# Patient Record
Sex: Male | Born: 1970
Health system: Southern US, Community
[De-identification: ages and names within clinical notes are randomized; demographics above are authoritative.]

## PROBLEM LIST (undated history)

## (undated) DIAGNOSIS — I10 Essential (primary) hypertension: Secondary | ICD-10-CM

## (undated) DIAGNOSIS — T7840XA Allergy, unspecified, initial encounter: Secondary | ICD-10-CM

## (undated) DIAGNOSIS — E119 Type 2 diabetes mellitus without complications: Secondary | ICD-10-CM

## (undated) DIAGNOSIS — E785 Hyperlipidemia, unspecified: Secondary | ICD-10-CM

## (undated) DIAGNOSIS — J309 Allergic rhinitis, unspecified: Secondary | ICD-10-CM

## (undated) DIAGNOSIS — K449 Diaphragmatic hernia without obstruction or gangrene: Secondary | ICD-10-CM

## (undated) DIAGNOSIS — I639 Cerebral infarction, unspecified: Secondary | ICD-10-CM

## (undated) DIAGNOSIS — K219 Gastro-esophageal reflux disease without esophagitis: Secondary | ICD-10-CM

## (undated) DIAGNOSIS — K4090 Unilateral inguinal hernia, without obstruction or gangrene, not specified as recurrent: Secondary | ICD-10-CM

## (undated) DIAGNOSIS — E669 Obesity, unspecified: Secondary | ICD-10-CM

## (undated) HISTORY — DX: Type 2 diabetes mellitus without complications: E11.9

## (undated) HISTORY — DX: Allergic rhinitis, unspecified: J30.9

## (undated) HISTORY — DX: Obesity, unspecified: E66.9

## (undated) HISTORY — DX: Gastro-esophageal reflux disease without esophagitis: K21.9

## (undated) HISTORY — DX: Unilateral inguinal hernia, without obstruction or gangrene, not specified as recurrent: K40.90

## (undated) HISTORY — DX: Hyperlipidemia, unspecified: E78.5

## (undated) HISTORY — PX: WISDOM TOOTH EXTRACTION: SHX21

## (undated) HISTORY — DX: Allergy, unspecified, initial encounter: T78.40XA

## (undated) HISTORY — DX: Diaphragmatic hernia without obstruction or gangrene: K44.9

## (undated) HISTORY — DX: Cerebral infarction, unspecified: I63.9

---

## 2011-01-01 ENCOUNTER — Emergency Department (HOSPITAL_COMMUNITY): Payer: Self-pay

## 2011-01-01 ENCOUNTER — Emergency Department (HOSPITAL_COMMUNITY)
Admission: EM | Admit: 2011-01-01 | Discharge: 2011-01-01 | Disposition: A | Discharge status: Home / Self Care | Payer: Self-pay | Attending: Emergency Medicine | Admitting: Emergency Medicine

## 2011-01-01 DIAGNOSIS — M25559 Pain in unspecified hip: Secondary | ICD-10-CM | POA: Insufficient documentation

## 2011-01-01 DIAGNOSIS — IMO0002 Reserved for concepts with insufficient information to code with codable children: Secondary | ICD-10-CM | POA: Insufficient documentation

## 2011-01-01 DIAGNOSIS — X500XXA Overexertion from strenuous movement or load, initial encounter: Secondary | ICD-10-CM | POA: Insufficient documentation

## 2011-01-01 DIAGNOSIS — Y9341 Activity, dancing: Secondary | ICD-10-CM | POA: Insufficient documentation

## 2011-01-01 DIAGNOSIS — K219 Gastro-esophageal reflux disease without esophagitis: Secondary | ICD-10-CM | POA: Insufficient documentation

## 2015-05-04 ENCOUNTER — Ambulatory Visit: Payer: Self-pay | Admitting: Family Medicine

## 2015-05-12 ENCOUNTER — Encounter: Payer: Self-pay | Admitting: Family Medicine

## 2015-05-12 ENCOUNTER — Ambulatory Visit (INDEPENDENT_AMBULATORY_CARE_PROVIDER_SITE_OTHER): Payer: BLUE CROSS/BLUE SHIELD | Admitting: Family Medicine

## 2015-05-12 VITALS — BP 134/72 | HR 96 | Temp 99.6°F | Ht 72.0 in | Wt 245.0 lb

## 2015-05-12 DIAGNOSIS — R1032 Left lower quadrant pain: Secondary | ICD-10-CM

## 2015-05-12 DIAGNOSIS — Z8249 Family history of ischemic heart disease and other diseases of the circulatory system: Secondary | ICD-10-CM

## 2015-05-12 DIAGNOSIS — J309 Allergic rhinitis, unspecified: Secondary | ICD-10-CM | POA: Insufficient documentation

## 2015-05-12 DIAGNOSIS — Z720 Tobacco use: Secondary | ICD-10-CM | POA: Diagnosis not present

## 2015-05-12 DIAGNOSIS — F17200 Nicotine dependence, unspecified, uncomplicated: Secondary | ICD-10-CM

## 2015-05-12 DIAGNOSIS — E669 Obesity, unspecified: Secondary | ICD-10-CM

## 2015-05-12 DIAGNOSIS — K219 Gastro-esophageal reflux disease without esophagitis: Secondary | ICD-10-CM | POA: Diagnosis not present

## 2015-05-12 HISTORY — DX: Nicotine dependence, unspecified, uncomplicated: F17.200

## 2015-05-12 HISTORY — DX: Family history of ischemic heart disease and other diseases of the circulatory system: Z82.49

## 2015-05-12 NOTE — Assessment & Plan Note (Signed)
S: Father died of heart attack at age 43. Father was a heavier smoker and ate very poorly with not great medical follow up but patient realizes he still has increased risk. Discussed role of smoking A/P: before getting lipids and other labs, we discussed letting patient focus on losing 20 lbs which he has gained this year. This started with exercise decrease from 4-5 days a week to rare currently. He is going to get back to the exercise which he is essentially asymptomatic during. Check labs in 6 months as well as get EKG after he has made these changes.

## 2015-05-12 NOTE — Assessment & Plan Note (Signed)
S: 1/2 PPD down from 1 PPD A/P: strongly advised cessation with family history CAD early but patient not ready at this time. States would benefit from continued encouragement to quit so this will be provided

## 2015-05-12 NOTE — Patient Instructions (Addendum)
You set a goal to shed the extra 20 lbs you have gained through healthy eating and getting exercise level back up  With your family history of heart disease, I strongly encourage you to quit smoking. If you need assistance with using gums, patches I am glad to help and there are a few pharmacologic drug options as well if needed  See me in 6 months for a physical. Set a lab visit for a few days before. Schedule both at the front desk. Return for future fasting labs. Nothing but water after midnight please.   Keep an eye on the spot in your left groin. We may need to consider imaging or surgery referral if true hernia and affecting your ability to do the things you want to do.   Happy to provide flu shot if you change your mind  Suspect we will need to get an EKG and give you some immunizations (tetanus next visit)

## 2015-05-12 NOTE — Assessment & Plan Note (Signed)
S: history of hiatal hernia. Reflux symptoms controlled on prilosec A/P: we discussed potential longterm risks of prilosec but with excellent symptom control opted to continue current rx

## 2015-05-12 NOTE — Progress Notes (Signed)
Jack Reddish, MD Phone: [phone]  Subjective:  Patient presents today to establish care as new patient. Last time consistently seen was in 72s.  Chief complaint-noted.   See problem oriented charting  The following were reviewed and entered/updated in epic: Past Medical History  Diagnosis Date  . Allergic rhinitis     otc flonase  . Hiatal hernia   . Obesity   . GERD (gastroesophageal reflux disease)     prilosec- hiatal hernia   Patient Active Problem List   Diagnosis Date Noted  . Smoker 05/12/2015    Priority: High  . Family history of premature CAD 05/12/2015    Priority: High  . Obesity     Priority: Medium  . Allergic rhinitis     Priority: Low  . GERD (gastroesophageal reflux disease)     Priority: Low   Past Surgical History  Procedure Laterality Date  . Wisdom tooth extraction      general anesthesia    Family History  Problem Relation Age of Onset  . Arthritis Mother   . CAD Father   . Sudden death Father     heart attack 50- constant stress, poor food choices, smoked heavier than patient  . Diabetes Mother     grandmother    Medications- reviewed and updated Current Outpatient Prescriptions  Medication Sig Dispense Refill  . fluticasone (FLONASE) 50 MCG/ACT nasal spray Place into both nostrils daily.    . Omeprazole Magnesium (PRILOSEC OTC PO) Take by mouth.     No current facility-administered medications for this visit.    Allergies-reviewed and updated No Known Allergies  Social History   Social History  . Marital Status: Single    Spouse Name: N/A  . Number of Children: N/A  . Years of Education: N/A   Social History Main Topics  . Smoking status: Current Every Day Smoker -- 0.50 packs/day    Types: Cigarettes  . Smokeless tobacco: None  . Alcohol Use: 1.2 - 3.0 oz/week    2-5 Standard drinks or equivalent per week  . Drug Use: Yes     Comment: rare marijuana  . Sexual Activity: Not Asked   Other Topics Concern  .  None   Social History Narrative   Family: Single. Lives with roommate.       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 No college      Hobbies: time at gym usually 4-5 days a week, friends and family, vacation    ROS--See HPI , otherwise full ROS was completed and negative except as noted above Pertinent no chest pain or shortness of breath  Objective: BP 134/72 mmHg  Pulse 96  Temp(Src) 99.6 F (37.6 C)  Ht 6' (1.829 m)  Wt 245 lb (111.131 kg)  BMI 33.22 kg/m2 Gen: NAD, resting comfortably HEENT: Mucous membranes are moist. Oropharynx normal. TM normal. Eyes: sclera and lids normal, PERRLA Neck: no thyromegaly, no cervical lymphadenopathy CV: RRR no murmurs rubs or gallops Lungs: CTAB no crackles, wheeze, rhonchi Abdomen: soft/nontender/nondistended/normal bowel sounds. No rebound or guarding.  No bulge in groin noted Ext: no edema Skin: warm, dry Neuro: 5/5 strength in upper and lower extremities, normal gait, normal reflexes  Assessment/Plan:  Smoker S: 1/2 PPD down from 1 PPD A/P: strongly advised cessation with family history CAD early but patient not ready at this time. States would benefit from continued encouragement to quit so this will be provided  Family history of premature CAD S: Father died of heart attack at age 62. Father was a heavier smoker and ate very poorly with not great medical follow up but patient realizes he still has increased risk. Discussed role of smoking A/P: before getting lipids and other labs, we discussed letting patient focus on losing 20 lbs which he has gained this year. This started with exercise decrease from 4-5 days a week to rare currently. He is going to get back to the exercise which he is essentially asymptomatic during. Check labs in 6 months as well as get EKG after he has made these changes.    Obesity S: Patient states he is comfortable with a weight of around  215 but per BMI needs to be around 190 and he does not feel healthy at that weight. Does have some increased muscle mass and enjoys lifting Wt Readings from Last 3 Encounters:  05/12/15 245 lb (111.131 kg)  A/P: set goal 20 lbs down then will reevaluate though suspect need to push closer to 215 or lower.    GERD (gastroesophageal reflux disease) S: history of hiatal hernia. Reflux symptoms controlled on prilosec A/P: we discussed potential longterm risks of prilosec but with excellent symptom control opted to continue current rx   L groin bulge S:6 months left groin discomfort- feels like something moving, has felt bulge. Used to be worse when thinner and doing situps- avoiding situps currently A/P: did not feel any hernia on exam. We discussed continue to monitoring and red flags for sooner return. Consider surgery consult vs. Imaging potentially.  Return precautions advised.   6 months CPE

## 2015-05-12 NOTE — Assessment & Plan Note (Signed)
S: Patient states he is comfortable with a weight of around 215 but per BMI needs to be around 190 and he does not feel healthy at that weight. Does have some increased muscle mass and enjoys lifting Wt Readings from Last 3 Encounters:  05/12/15 245 lb (111.131 kg)  A/P: set goal 20 lbs down then will reevaluate though suspect need to push closer to 215 or lower.

## 2015-11-04 ENCOUNTER — Other Ambulatory Visit (INDEPENDENT_AMBULATORY_CARE_PROVIDER_SITE_OTHER): Payer: BLUE CROSS/BLUE SHIELD

## 2015-11-04 DIAGNOSIS — Z0000 Encounter for general adult medical examination without abnormal findings: Secondary | ICD-10-CM | POA: Diagnosis not present

## 2015-11-04 LAB — CBC WITH DIFFERENTIAL/PLATELET
BASOS ABS: 0 10*3/uL (ref 0.0–0.1)
Basophils Relative: 0.4 % (ref 0.0–3.0)
EOS ABS: 0.1 10*3/uL (ref 0.0–0.7)
EOS PCT: 0.9 % (ref 0.0–5.0)
HCT: 47.8 % (ref 39.0–52.0)
HEMOGLOBIN: 16.4 g/dL (ref 13.0–17.0)
LYMPHS ABS: 2.1 10*3/uL (ref 0.7–4.0)
Lymphocytes Relative: 37.2 % (ref 12.0–46.0)
MCHC: 34.4 g/dL (ref 30.0–36.0)
MCV: 93 fl (ref 78.0–100.0)
MONO ABS: 0.4 10*3/uL (ref 0.1–1.0)
Monocytes Relative: 7.7 % (ref 3.0–12.0)
NEUTROS PCT: 53.8 % (ref 43.0–77.0)
Neutro Abs: 3 10*3/uL (ref 1.4–7.7)
Platelets: 215 10*3/uL (ref 150.0–400.0)
RBC: 5.14 Mil/uL (ref 4.22–5.81)
RDW: 12.4 % (ref 11.5–15.5)
WBC: 5.6 10*3/uL (ref 4.0–10.5)

## 2015-11-04 LAB — POC URINALSYSI DIPSTICK (AUTOMATED)
BILIRUBIN UA: NEGATIVE
Blood, UA: NEGATIVE
Glucose, UA: NEGATIVE
KETONES UA: NEGATIVE
LEUKOCYTES UA: NEGATIVE
Nitrite, UA: NEGATIVE
PH UA: 6.5
PROTEIN UA: NEGATIVE
SPEC GRAV UA: 1.015
Urobilinogen, UA: 0.2

## 2015-11-04 LAB — BASIC METABOLIC PANEL
BUN: 15 mg/dL (ref 6–23)
CALCIUM: 9.6 mg/dL (ref 8.4–10.5)
CO2: 28 mEq/L (ref 19–32)
CREATININE: 0.97 mg/dL (ref 0.40–1.50)
Chloride: 102 mEq/L (ref 96–112)
GFR: 89.44 mL/min (ref >60.00)
GLUCOSE: 96 mg/dL (ref 70–99)
POTASSIUM: 4 meq/L (ref 3.5–5.1)
Sodium: 139 mEq/L (ref 135–145)

## 2015-11-04 LAB — LIPID PANEL
CHOL/HDL RATIO: 6
CHOLESTEROL: 215 mg/dL — AB (ref 0–200)
HDL: 34.8 mg/dL — ABNORMAL LOW (ref >39.00)
LDL CALC: 159 mg/dL — AB (ref 0–99)
NonHDL: 180.38
Triglycerides: 106 mg/dL (ref 0.0–149.0)
VLDL: 21.2 mg/dL (ref 0.0–40.0)

## 2015-11-04 LAB — HEPATIC FUNCTION PANEL
ALK PHOS: 46 U/L (ref 39–117)
ALT: 42 U/L (ref 0–53)
AST: 26 U/L (ref 0–37)
Albumin: 4.6 g/dL (ref 3.5–5.2)
BILIRUBIN DIRECT: 0.2 mg/dL (ref 0.0–0.3)
BILIRUBIN TOTAL: 0.8 mg/dL (ref 0.2–1.2)
Total Protein: 7.3 g/dL (ref 6.0–8.3)

## 2015-11-04 LAB — TSH: TSH: 1.01 u[IU]/mL (ref 0.35–4.50)

## 2015-11-05 ENCOUNTER — Other Ambulatory Visit: Payer: BLUE CROSS/BLUE SHIELD

## 2015-11-10 ENCOUNTER — Encounter: Payer: Self-pay | Admitting: Family Medicine

## 2015-11-10 ENCOUNTER — Ambulatory Visit (INDEPENDENT_AMBULATORY_CARE_PROVIDER_SITE_OTHER): Payer: BLUE CROSS/BLUE SHIELD | Admitting: Family Medicine

## 2015-11-10 VITALS — BP 126/72 | HR 93 | Temp 97.8°F | Ht 72.0 in | Wt 253.0 lb

## 2015-11-10 DIAGNOSIS — Z8249 Family history of ischemic heart disease and other diseases of the circulatory system: Secondary | ICD-10-CM

## 2015-11-10 DIAGNOSIS — Z202 Contact with and (suspected) exposure to infections with a predominantly sexual mode of transmission: Secondary | ICD-10-CM

## 2015-11-10 DIAGNOSIS — Z23 Encounter for immunization: Secondary | ICD-10-CM

## 2015-11-10 DIAGNOSIS — K4090 Unilateral inguinal hernia, without obstruction or gangrene, not specified as recurrent: Secondary | ICD-10-CM

## 2015-11-10 DIAGNOSIS — R6889 Other general symptoms and signs: Secondary | ICD-10-CM

## 2015-11-10 DIAGNOSIS — Z0001 Encounter for general adult medical examination with abnormal findings: Secondary | ICD-10-CM

## 2015-11-10 DIAGNOSIS — E785 Hyperlipidemia, unspecified: Secondary | ICD-10-CM | POA: Diagnosis not present

## 2015-11-10 HISTORY — DX: Unilateral inguinal hernia, without obstruction or gangrene, not specified as recurrent: K40.90

## 2015-11-10 MED ORDER — ATORVASTATIN CALCIUM 20 MG PO TABS: 20.0000 mg | ORAL_TABLET | Freq: Every day | ORAL | Status: DC

## 2015-11-10 NOTE — Progress Notes (Signed)
Phone: [phone]  Subjective:  Patient presents today for their annual physical. Chief complaint-noted.   See problem oriented charting- ROS- full  review of systems was completed and negative except for: slight discomfort in left groin at times. With exercise- No chest pain or shortness of breath. No headache or blurry vision.   The following were reviewed and entered/updated in epic: Past Medical History  Diagnosis Date  . Allergic rhinitis     otc flonase  . Hiatal hernia   . Obesity   . GERD (gastroesophageal reflux disease)     prilosec- hiatal hernia   Patient Active Problem List   Diagnosis Date Noted  . Smoker 05/12/2015    Priority: High  . Family history of premature CAD 05/12/2015    Priority: High  . Left groin hernia 11/10/2015    Priority: Medium  . Obesity     Priority: Medium  . Hyperlipidemia 11/10/2015    Priority: Low  . Allergic rhinitis     Priority: Low  . GERD (gastroesophageal reflux disease)     Priority: Low   Past Surgical History  Procedure Laterality Date  . Wisdom tooth extraction      general anesthesia    Family History  Problem Relation Age of Onset  . Arthritis Mother   . CAD Father   . Sudden death Father     heart attack 16- constant stress, poor food choices, smoked heavier than patient  . Diabetes Mother     grandmother    Medications- reviewed and updated Current Outpatient Prescriptions  Medication Sig Dispense Refill  . fluticasone (FLONASE) 50 MCG/ACT nasal spray Place into both nostrils daily.    . Omeprazole Magnesium (PRILOSEC OTC PO) Take by mouth.     No current facility-administered medications for this visit.    Allergies-reviewed and updated No Known Allergies  Social History   Social History  . Marital Status: Single    Spouse Name: N/A  . Number of Children: N/A  . Years of Education: N/A   Social History Main Topics  . Smoking status: Current Every Day Smoker -- 0.50 packs/day    Types:  Cigarettes  . Smokeless tobacco: Not on file  . Alcohol Use: 1.2 - 3.0 oz/week    2-5 Standard drinks or equivalent per week  . Drug Use: Yes     Comment: rare marijuana  . Sexual Activity: Not on file   Other Topics Concern  . Not on file   Social History Narrative   Family: Single. Lives with roommate.       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 No college      Hobbies: time at gym usually 4-5 days a week, friends and family, vacation   Objective: BP 126/72 mmHg  Pulse 93  Temp(Src) 97.8 F (36.6 C)  Ht 6' (1.829 m)  Wt 253 lb (114.76 kg)  BMI 34.31 kg/m2 Gen: NAD, resting comfortably HEENT: Mucous membranes are moist. Oropharynx normal Neck: no thyromegaly CV: RRR no murmurs rubs or gallops Lungs: CTAB no crackles, wheeze, rhonchi Abdomen: soft/nontender/nondistended/normal bowel sounds. No rebound or guarding.  GU: normal testicular exam left larger than right In left groin, bulge felt with valsalva, easily reducible Ext: no edema Skin: warm, dry Neuro: grossly normal, moves all extremities, PERRLA  EKG: sinus rhythm with rate of 78, normal axis, normal intervals, no hypertrophy, no st or t wave changes.   Assessment/Plan:  45 y.o. male presenting for annual physical.  Health Maintenance counseling: 1. Anticipatory guidance: Patient counseled regarding regular dental exams, eye exams, wearing seatbelts.  2. Risk factor reduction:  Advised patient of need for regular exercise and diet rich and fruits and vegetables to reduce risk of heart attack and stroke. Plan last visit was to increase from near 0 to 4-5 days a week exercise-far surpassing doing 5-6 days a week at gym. Has gained weight but on muscle mass and waist line tighter.  Wt Readings from Last 3 Encounters:  11/10/15 253 lb (114.76 kg)  05/12/15 245 lb (111.131 kg)  3. Immunizations/screenings/ancillary studies Health Maintenance Due    Topic Date Due  . HIV Screening - next labs.  01/14/1987  . TETANUS/TDAP - advised today, given 01/14/1991   4. Prostate cancer screening- start at age 75 with no family history  5. Colon cancer screening - start at age 64 with no family history  6. Skin cancer screening- goes to dermatologist yearly basis 7. Testicular cancer screening- advised monthly testicular self exams  Smoker- 1/2 PPD last visit, previously 1 PPD, advised cessation Premature CAD in father- check EKG- will refer to cardiology given history as wouldlike to be plugged GERD_ excellent control on prilosec Hernia - noted in left groin- wants to see cards first, may consider surgical referral in future  Hyperlipidemia S: poorly controlled on no medicine. No myalgias.  Lab Results  Component Value Date   CHOL 215* 11/04/2015   HDL 34.80* 11/04/2015   LDLCALC 159* 11/04/2015   TRIG 106.0 11/04/2015   CHOLHDL 6 11/04/2015   A/P: will start atorvastatin 20mg . Discussed at minimum to take every other day and continue weight loss journey    Left groin hernia May call for surgical referral in future- not ready right now  Family history of premature CAD Father died of heart attack at age 8. Start statin- cards referral   2 month labs, 1 year CPE. Return precautions advised.   Orders Placed This Encounter  Procedures  . Tdap vaccine greater than or equal to 7yo IM  . LDL cholesterol, direct    [facility]    Standing Status: Future     Number of Occurrences:      Standing Expiration Date: 11/09/2016  . HIV antibody    Standing Status: Future     Number of Occurrences:      Standing Expiration Date: 11/09/2016  . RPR    [facility]    Standing Status: Future     Number of Occurrences:      Standing Expiration Date: 11/09/2016  . Ambulatory referral to Cardiology    Referral Priority:  Routine    Referral Type:  Consultation    Referral Reason:  Specialty Services Required    Requested Specialty:  Cardiology     Number of Visits Requested:  1  . EKG 12-Lead    Meds ordered this encounter  Medications  . atorvastatin (LIPITOR) 20 MG tablet    Sig: Take 1 tablet (20 mg total) by mouth daily at 6 PM.    Dispense:  90 tablet    Refill:  3  planning daily unless SE- can go to every other day  Garret Reddish, MD

## 2015-11-10 NOTE — Patient Instructions (Addendum)
will start atorvastatin 20mg . Discussed at minimum to take every other day and continue weight loss journey. Even 3x a week will reduce your cardiac risk some, taking daily will reduce further. I am giving you this flexibility since you are not used to taking medicine regularly so you dont get discouraged and stop altogether.   TDAP received today.  Return in 2 months (schedule fasting labs). Also do not pee for an hour before you come in, do not clean penis before urine test, and only pee small amount for STD testing.   Strongly advise quitting smoking  We will call you within a week about your referral to cardiology. If you do not hear within 2 weeks, give [place] a call.   If you change your mind about hernia and want to see surgery- please just give [place] a call

## 2015-11-10 NOTE — Assessment & Plan Note (Signed)
May call for surgical referral in future- not ready right now

## 2015-11-10 NOTE — Assessment & Plan Note (Signed)
Father died of heart attack at age 44. Start statin- cards referral

## 2015-11-10 NOTE — Assessment & Plan Note (Signed)
S: poorly controlled on no medicine. No myalgias.  Lab Results  Component Value Date   CHOL 215* 11/04/2015   HDL 34.80* 11/04/2015   LDLCALC 159* 11/04/2015   TRIG 106.0 11/04/2015   CHOLHDL 6 11/04/2015   A/P: will start atorvastatin 20mg . Discussed at minimum to take every other day and continue weight loss journey

## 2015-12-08 ENCOUNTER — Ambulatory Visit: Payer: BLUE CROSS/BLUE SHIELD | Admitting: Interventional Cardiology

## 2015-12-16 ENCOUNTER — Ambulatory Visit (INDEPENDENT_AMBULATORY_CARE_PROVIDER_SITE_OTHER): Payer: BLUE CROSS/BLUE SHIELD

## 2015-12-16 ENCOUNTER — Encounter: Payer: Self-pay | Admitting: Interventional Cardiology

## 2015-12-16 ENCOUNTER — Ambulatory Visit (INDEPENDENT_AMBULATORY_CARE_PROVIDER_SITE_OTHER): Payer: BLUE CROSS/BLUE SHIELD | Admitting: Interventional Cardiology

## 2015-12-16 VITALS — BP 118/76 | HR 80 | Ht 73.0 in | Wt 251.0 lb

## 2015-12-16 DIAGNOSIS — E785 Hyperlipidemia, unspecified: Secondary | ICD-10-CM

## 2015-12-16 DIAGNOSIS — Z720 Tobacco use: Secondary | ICD-10-CM

## 2015-12-16 DIAGNOSIS — Z8249 Family history of ischemic heart disease and other diseases of the circulatory system: Secondary | ICD-10-CM

## 2015-12-16 LAB — EXERCISE TOLERANCE TEST
CHL CUP MPHR: 177 {beats}/min
CSEPED: 12 min
CSEPEDS: 0 s
CSEPPHR: 153 {beats}/min
Estimated workload: 13.7 METS
Percent HR: 86 %
RPE: 15
Rest HR: 75 {beats}/min

## 2015-12-16 NOTE — Progress Notes (Signed)
Cardiology Office Note   Date:  12/16/2015   ID:  [name], DOB 1971/02/09, MRN [id number]  PCP:  Garret Reddish, MD    No chief complaint on file. family h/o CAD   Wt Readings from Last 3 Encounters:  12/16/15 251 lb (113.853 kg)  11/10/15 253 lb (114.76 kg)  05/12/15 245 lb (111.131 kg)       History of Present Illness: Jack Hall. is a 45 y.o. male  Who had high cholesterol disconvered.  He has been on medicine for this since 17-Oct-2015.  His father passed away at age 20.  No other family members with early death from CAD.  THe father did smoke heavily.  THe patient smokes a half pack a day.  He is not trying to quit.    He exercises regularly 3-4x /week.  He lifts weights 4-5x/week.  He has gained 25 lbs.  He works at an [employer].    He eats healthy.    He is concerned about his risk for heart disease.      Past Medical History  Diagnosis Date  . Allergic rhinitis     otc flonase  . Hiatal hernia   . Obesity   . GERD (gastroesophageal reflux disease)     prilosec- hiatal hernia    Past Surgical History  Procedure Laterality Date  . Wisdom tooth extraction      general anesthesia     Current Outpatient Prescriptions  Medication Sig Dispense Refill  . atorvastatin (LIPITOR) 20 MG tablet Take 1 tablet (20 mg total) by mouth daily at 6 PM. 90 tablet 3  . fluticasone (FLONASE) 50 MCG/ACT nasal spray Place into both nostrils as needed.     . Omeprazole Magnesium (PRILOSEC OTC PO) Take by mouth.     No current facility-administered medications for this visit.    Allergies:   Review of patient's allergies indicates no known allergies.    Social History:  The patient  reports that he has been smoking Cigarettes.  He has been smoking about 0.50 packs per day. He does not have any smokeless tobacco history on file. He reports that he drinks about 1.2 - 3.0 oz of alcohol per week. He reports that he uses illicit drugs.    Family History:  The patient's family history includes Arthritis in his mother; CAD in his father; Diabetes in his mother; Sudden death in his father.    ROS:  Please see the history of present illness.   Otherwise, review of systems are positive for weight gain recently.   All other systems are reviewed and negative.    PHYSICAL EXAM: VS:  BP 118/76 mmHg  Pulse 80  Ht 6\' 1"  (1.854 m)  Wt 251 lb (113.853 kg)  BMI 33.12 kg/m2 , BMI Body mass index is 33.12 kg/(m^2). GEN: Well nourished, well developed, in no acute distress HEENT: normal Neck: no JVD, carotid bruits, or masses Cardiac: RRR; no murmurs, rubs, or gallops,no edema  Respiratory:  clear to auscultation bilaterally, normal work of breathing GI: soft, nontender, nondistended, + BS MS: no deformity or atrophy Skin: warm and dry, no rash Neuro:  Strength and sensation are intact Psych: euthymic mood, full affect   EKG:   The ekg ordered in 4/17 demonstrates normal   Recent Labs: 11/04/2015: ALT 42; BUN 15; Creatinine, Ser 0.97; Hemoglobin 16.4; Platelets 215.0; Potassium 4.0; Sodium 139; TSH 1.01   Lipid Panel    Component Value  Date/Time   CHOL 215* 11/04/2015 0935   TRIG 106.0 11/04/2015 0935   HDL 34.80* 11/04/2015 0935   CHOLHDL 6 11/04/2015 0935   VLDL 21.2 11/04/2015 0935   LDLCALC 159* 11/04/2015 0935     Other studies Reviewed: Additional studies/ records that were reviewed today with results demonstrating: prior ECG.   ASSESSMENT AND PLAN:  1. Family history of early coronary artery disease: We stressed the importance of risk factor modification. Will plan for exercise treadmill test to evaluate exercise capacity. 2. Hyperlipidemia: Continue atorvastatin. Agree with starting statin given his family history. 3. Tobacco abuse: I strongly recommended that he try to stop smoking. He may try OTC patches.   Current medicines are reviewed at length with the patient today.  The patient concerns regarding  his medicines were addressed.  The following changes have been made:  No change  Labs/ tests ordered today include:  No orders of the defined types were placed in this encounter.    Recommend 150 minutes/week of aerobic exercise Low fat, low carb, high fiber diet recommended  Disposition:   FU for stress test   Signed, Larae Grooms, MD  12/16/2015 12:04 PM    [facility] [place], [place], [place]  [address] Phone: [phone]; Fax: [phone]

## 2015-12-16 NOTE — Patient Instructions (Signed)
Medication Instructions:  Same-no changes  Labwork: None  Testing/Procedures: Your physician has requested that you have an exercise tolerance test. For further information please visit [email or web address]. Please also follow instruction sheet, as given.  Follow-Up: Your physician recommends that you schedule a follow-up appointment in: as needed   Any Other Special Instructions Will Be Listed Below (If Applicable). 1-800- Quit-Now [phone]  If you need a refill on your cardiac medications before your next appointment, please call your pharmacy.

## 2016-01-10 ENCOUNTER — Other Ambulatory Visit: Payer: BLUE CROSS/BLUE SHIELD

## 2016-01-26 ENCOUNTER — Other Ambulatory Visit (INDEPENDENT_AMBULATORY_CARE_PROVIDER_SITE_OTHER): Payer: BLUE CROSS/BLUE SHIELD

## 2016-01-26 ENCOUNTER — Other Ambulatory Visit (HOSPITAL_COMMUNITY)
Admission: RE | Admit: 2016-01-26 | Discharge: 2016-01-26 | Disposition: A | Discharge status: Home / Self Care | Payer: BLUE CROSS/BLUE SHIELD | Source: Ambulatory Visit | Attending: Family Medicine | Admitting: Family Medicine

## 2016-01-26 DIAGNOSIS — Z0001 Encounter for general adult medical examination with abnormal findings: Secondary | ICD-10-CM

## 2016-01-26 DIAGNOSIS — Z202 Contact with and (suspected) exposure to infections with a predominantly sexual mode of transmission: Secondary | ICD-10-CM

## 2016-01-26 DIAGNOSIS — Z0000 Encounter for general adult medical examination without abnormal findings: Secondary | ICD-10-CM | POA: Diagnosis not present

## 2016-01-26 DIAGNOSIS — E785 Hyperlipidemia, unspecified: Secondary | ICD-10-CM

## 2016-01-26 DIAGNOSIS — Z113 Encounter for screening for infections with a predominantly sexual mode of transmission: Secondary | ICD-10-CM | POA: Diagnosis not present

## 2016-01-26 DIAGNOSIS — R6889 Other general symptoms and signs: Secondary | ICD-10-CM

## 2016-01-26 LAB — POC URINALSYSI DIPSTICK (AUTOMATED)
Bilirubin, UA: NEGATIVE
Glucose, UA: NEGATIVE
Ketones, UA: NEGATIVE
Leukocytes, UA: NEGATIVE
NITRITE UA: NEGATIVE
PH UA: 7
RBC UA: NEGATIVE
SPEC GRAV UA: 1.02
UROBILINOGEN UA: 1

## 2016-01-26 LAB — HEPATIC FUNCTION PANEL
ALBUMIN: 4.7 g/dL (ref 3.5–5.2)
ALK PHOS: 53 U/L (ref 39–117)
ALT: 24 U/L (ref 0–53)
AST: 17 U/L (ref 0–37)
BILIRUBIN DIRECT: 0.2 mg/dL (ref 0.0–0.3)
TOTAL PROTEIN: 7.1 g/dL (ref 6.0–8.3)
Total Bilirubin: 1 mg/dL (ref 0.2–1.2)

## 2016-01-26 LAB — CBC WITH DIFFERENTIAL/PLATELET
BASOS ABS: 0 10*3/uL (ref 0.0–0.1)
BASOS PCT: 0.4 % (ref 0.0–3.0)
EOS ABS: 0.1 10*3/uL (ref 0.0–0.7)
Eosinophils Relative: 1.7 % (ref 0.0–5.0)
HEMATOCRIT: 46.8 % (ref 39.0–52.0)
HEMOGLOBIN: 16.4 g/dL (ref 13.0–17.0)
LYMPHS PCT: 36.7 % (ref 12.0–46.0)
Lymphs Abs: 2.4 10*3/uL (ref 0.7–4.0)
MCHC: 35.1 g/dL (ref 30.0–36.0)
MCV: 91.8 fl (ref 78.0–100.0)
MONOS PCT: 7.6 % (ref 3.0–12.0)
Monocytes Absolute: 0.5 10*3/uL (ref 0.1–1.0)
NEUTROS ABS: 3.5 10*3/uL (ref 1.4–7.7)
Neutrophils Relative %: 53.6 % (ref 43.0–77.0)
PLATELETS: 228 10*3/uL (ref 150.0–400.0)
RBC: 5.09 Mil/uL (ref 4.22–5.81)
RDW: 12.8 % (ref 11.5–15.5)
WBC: 6.4 10*3/uL (ref 4.0–10.5)

## 2016-01-26 LAB — LDL CHOLESTEROL, DIRECT: LDL DIRECT: 125 mg/dL

## 2016-01-26 LAB — BASIC METABOLIC PANEL
BUN: 16 mg/dL (ref 6–23)
CALCIUM: 9.7 mg/dL (ref 8.4–10.5)
CO2: 28 mEq/L (ref 19–32)
Chloride: 103 mEq/L (ref 96–112)
Creatinine, Ser: 0.92 mg/dL (ref 0.40–1.50)
GFR: 94.98 mL/min (ref >60.00)
Glucose, Bld: 103 mg/dL — ABNORMAL HIGH (ref 70–99)
POTASSIUM: 4.1 meq/L (ref 3.5–5.1)
SODIUM: 139 meq/L (ref 135–145)

## 2016-01-26 LAB — LIPID PANEL
CHOL/HDL RATIO: 5
Cholesterol: 164 mg/dL (ref 0–200)
HDL: 31.8 mg/dL — AB (ref >39.00)
LDL Cholesterol: 106 mg/dL — ABNORMAL HIGH (ref 0–99)
NONHDL: 132.66
Triglycerides: 131 mg/dL (ref 0.0–149.0)
VLDL: 26.2 mg/dL (ref 0.0–40.0)

## 2016-01-26 LAB — TSH: TSH: 1.48 u[IU]/mL (ref 0.35–4.50)

## 2016-01-27 LAB — URINE CYTOLOGY ANCILLARY ONLY
Chlamydia: NEGATIVE
Neisseria Gonorrhea: NEGATIVE
TRICH (WINDOWPATH): NEGATIVE

## 2016-01-27 LAB — HIV ANTIBODY (ROUTINE TESTING W REFLEX): HIV 1&2 Ab, 4th Generation: NONREACTIVE

## 2016-01-27 LAB — RPR

## 2016-12-25 ENCOUNTER — Ambulatory Visit
Admission: EM | Admit: 2016-12-25 | Discharge: 2016-12-25 | Disposition: A | Discharge status: Home / Self Care | Payer: BLUE CROSS/BLUE SHIELD | Attending: Family Medicine | Admitting: Family Medicine

## 2016-12-25 DIAGNOSIS — H00012 Hordeolum externum right lower eyelid: Secondary | ICD-10-CM | POA: Diagnosis not present

## 2016-12-25 MED ORDER — ERYTHROMYCIN 5 MG/GM OP OINT: TOPICAL_OINTMENT | OPHTHALMIC | 0 refills | Status: DC

## 2016-12-25 NOTE — ED Triage Notes (Signed)
Pt notice over the weekend that his eye was a little irritated and used some allergy eye drops, and since then a pea size red bump has appeared.

## 2016-12-25 NOTE — ED Provider Notes (Signed)
[facility]    CSN: [id number] Arrival date & time: 12/25/16  1242     History   Chief Complaint Chief Complaint  Patient presents with  . Eye Problem    right eye    HPI Jack Hall. is a 46 y.o. male.    46 yo male with a c/o right eye irritation and a red bump on his right lower eyelid for the last 4-5 days. Denies any eye pain, fevers, chills, drainage.   The history is provided by the patient.  Eye Problem    Past Medical History:  Diagnosis Date  . Allergic rhinitis    otc flonase  . GERD (gastroesophageal reflux disease)    prilosec- hiatal hernia  . Hiatal hernia   . Obesity     Patient Active Problem List   Diagnosis Date Noted  . Hyperlipidemia 11/10/2015  . Left groin hernia 11/10/2015  . Smoker 05/12/2015  . Family history of premature CAD 05/12/2015  . Allergic rhinitis   . Obesity   . GERD (gastroesophageal reflux disease)     Past Surgical History:  Procedure Laterality Date  . WISDOM TOOTH EXTRACTION     general anesthesia       Home Medications    Prior to Admission medications   Medication Sig Start Date End Date Taking? Authorizing Provider  atorvastatin (LIPITOR) 20 MG tablet Take 1 tablet (20 mg total) by mouth daily at 6 PM. 11/10/15  Yes Marin Olp, MD  fluticasone [facility]) 50 MCG/ACT nasal spray Place into both nostrils as needed.    Yes [provider]  Omeprazole Magnesium (PRILOSEC OTC PO) Take by mouth.   Yes [provider]  erythromycin ophthalmic ointment Place a 1/2 inch ribbon of ointment into the lower eyelid. 12/25/16   Norval Gable, MD    Family History Family History  Problem Relation Age of Onset  . Arthritis Mother   . Diabetes Mother        grandmother  . CAD Father   . Sudden death Father        heart attack 47- constant stress, poor food choices, smoked heavier than patient    Social History Social History  Substance Use Topics  . Smoking  status: Current Every Day Smoker    Packs/day: 0.50    Types: Cigarettes  . Smokeless tobacco: Never Used  . Alcohol use 1.2 - 3.0 oz/week    2 - 5 Standard drinks or equivalent per week     Allergies   Patient has no known allergies.   Review of Systems Review of Systems   Physical Exam Triage Vital Signs ED Triage Vitals  Enc Vitals Group     BP 12/25/16 1341 (!) 111/58     Pulse Rate 12/25/16 1341 85     Resp 12/25/16 1341 18     Temp 12/25/16 1341 98.7 F (37.1 C)     Temp Source 12/25/16 1341 Oral     SpO2 12/25/16 1341 99 %     Weight 12/25/16 1339 240 lb (108.9 kg)     Height 12/25/16 1339 6\' 1"  (1.854 m)     Head Circumference --      Peak Flow --      Pain Score 12/25/16 1339 3     Pain Loc --      Pain Edu? --      Excl. in [place]? --    No data found.   Updated Vital Signs  BP (!) 111/58 (BP Location: Left Arm)   Pulse 85   Temp 98.7 F (37.1 C) (Oral)   Resp 18   Ht 6\' 1"  (1.854 m)   Wt 240 lb (108.9 kg)   SpO2 99%   BMI 31.66 kg/m   Visual Acuity Right Eye Distance: 20/40 Left Eye Distance: 20/50 Bilateral Distance: 20/30  Right Eye Near:   Left Eye Near:    Bilateral Near:     Physical Exam  Constitutional: He appears well-developed and well-nourished. No distress.  Eyes: Conjunctivae and EOM are normal. Pupils are equal, round, and reactive to light. Right eye exhibits hordeolum (right lower).    Skin: He is not diaphoretic.  Nursing note and vitals reviewed.    UC Treatments / Results  Labs (all labs ordered are listed, but only abnormal results are displayed) Labs Reviewed - No data to display  EKG  EKG Interpretation None       Radiology No results found.  Procedures Procedures (including critical care time)  Medications Ordered in UC Medications - No data to display   Initial Impression / Assessment and Plan / UC Course  I have reviewed the triage vital signs and the nursing notes.  Pertinent labs & imaging  results that were available during my care of the patient were reviewed by me and considered in my medical decision making (see chart for details).       Final Clinical Impressions(s) / UC Diagnoses   Final diagnoses:  Hordeolum externum of right lower eyelid    New Prescriptions Discharge Medication List as of 12/25/2016  1:58 PM    START taking these medications   Details  erythromycin ophthalmic ointment Place a 1/2 inch ribbon of ointment into the lower eyelid., Normal       1.diagnosis reviewed with patient 2. rx as per orders above; reviewed possible side effects, interactions, risks and benefits  3. Recommend supportive treatment with warm compresses to area 4. Follow-up prn if symptoms worsen or don't improve   Norval Gable, MD 12/25/16 1453

## 2017-01-11 ENCOUNTER — Other Ambulatory Visit: Payer: Self-pay | Admitting: Family Medicine

## 2017-04-12 ENCOUNTER — Other Ambulatory Visit: Payer: Self-pay | Admitting: Family Medicine

## 2017-04-13 ENCOUNTER — Telehealth: Payer: Self-pay

## 2017-04-13 NOTE — Telephone Encounter (Signed)
Called patient and left a voicemail message for patient to call [phone] to schedule an appointment as patient has not been seen in over a year and a half. Received a refill request from [facility] for a prescription refill.

## 2017-04-13 NOTE — Telephone Encounter (Signed)
Called and left a voicemail message asking for patient to call and schedule an appointment as he has not been seen in over a year

## 2017-04-18 NOTE — Telephone Encounter (Signed)
Received another refill request for patient:  Atorvastatin 20 mg  Qty: 90 tablet Instruction: Take 1 tablet by mouth once daily at 6pm   [facility] [address] [phone]

## 2017-04-18 NOTE — Telephone Encounter (Signed)
Called patient and left a voicemail asking patient to call the office and schedule an appointment so that we can refill his medication.

## 2017-04-18 NOTE — Telephone Encounter (Signed)
Patient returning missed phone call. Patient stated he would like to be seen for refill on medication at time of CPE that is scheduled for 10/24.

## 2017-04-25 ENCOUNTER — Ambulatory Visit
Admission: EM | Admit: 2017-04-25 | Discharge: 2017-04-25 | Disposition: A | Discharge status: Home / Self Care | Payer: BLUE CROSS/BLUE SHIELD | Attending: Family Medicine | Admitting: Family Medicine

## 2017-04-25 ENCOUNTER — Encounter: Payer: Self-pay | Admitting: *Deleted

## 2017-04-25 ENCOUNTER — Ambulatory Visit
Admit: 2017-04-25 | Discharge: 2017-04-25 | Disposition: A | Discharge status: Home / Self Care | Payer: BLUE CROSS/BLUE SHIELD | Attending: Family Medicine | Admitting: Family Medicine

## 2017-04-25 DIAGNOSIS — N5089 Other specified disorders of the male genital organs: Secondary | ICD-10-CM

## 2017-04-25 DIAGNOSIS — N442 Benign cyst of testis: Secondary | ICD-10-CM | POA: Diagnosis not present

## 2017-04-25 DIAGNOSIS — N4342 Spermatocele of epididymis, multiple: Secondary | ICD-10-CM | POA: Diagnosis not present

## 2017-04-25 DIAGNOSIS — N433 Hydrocele, unspecified: Secondary | ICD-10-CM | POA: Diagnosis not present

## 2017-04-25 NOTE — ED Triage Notes (Signed)
Patient started having right testicular pain 2 weeks ago that has progressively worsened. Patient did have right testicular swelling 20 years ago that resolved with antibiotics.

## 2017-04-25 NOTE — ED Provider Notes (Signed)
[facility]    CSN: [id number] Arrival date & time: 04/25/17  1257  History   Chief Complaint Chief Complaint  Patient presents with  . Groin Swelling   HPI  46 year old male presents with swelling/mass of the right testicle.  Patient reports that for the past 1.5 weeks he's had an area of swelling in his right testicle. He states that it was slightly tender and he noticed it while he was in shower. Area is firm. Slightly tender to palpation. He's had no fevers or chills. No urinary symptoms. He states that he's not in any pain at this time. He is quite concerned about this. He states that he had a case of epididymitis approximately 20 years ago and thinks this may be the case. No reports of trauma. No known exacerbating or relieving factors. No other associated symptoms. No other complaints at this time.  Past Medical History:  Diagnosis Date  . Allergic rhinitis    otc flonase  . GERD (gastroesophageal reflux disease)    prilosec- hiatal hernia  . Hiatal hernia   . Obesity    Patient Active Problem List   Diagnosis Date Noted  . Hyperlipidemia 11/10/2015  . Left groin hernia 11/10/2015  . Smoker 05/12/2015  . Family history of premature CAD 05/12/2015  . Allergic rhinitis   . Obesity   . GERD (gastroesophageal reflux disease)    Past Surgical History:  Procedure Laterality Date  . WISDOM TOOTH EXTRACTION     general anesthesia    Home Medications    Prior to Admission medications   Medication Sig Start Date End Date Taking? Authorizing Provider  atorvastatin (LIPITOR) 20 MG tablet TAKE 1 TABLET BY MOUTH ONCE DAILY AT 6PM 04/24/17  Yes Marin Olp, MD  Omeprazole Magnesium (PRILOSEC OTC PO) Take by mouth.   Yes [provider]  erythromycin ophthalmic ointment Place a 1/2 inch ribbon of ointment into the lower eyelid. 12/25/16   Norval Gable, MD  fluticasone (FLONASE) 50 MCG/ACT nasal spray Place into both nostrils as needed.      [provider]    Family History Family History  Problem Relation Age of Onset  . Arthritis Mother   . Diabetes Mother        grandmother  . CAD Father   . Sudden death Father        heart attack 43- constant stress, poor food choices, smoked heavier than patient    Social History Social History  Substance Use Topics  . Smoking status: Current Every Day Smoker    Packs/day: 0.50    Types: Cigarettes  . Smokeless tobacco: Never Used  . Alcohol use 1.2 - 3.0 oz/week    2 - 5 Standard drinks or equivalent per week   Allergies   Patient has no known allergies.   Review of Systems Review of Systems  Genitourinary:       Testicular mass/swelling.  All other systems reviewed and are negative.  Physical Exam Triage Vital Signs ED Triage Vitals  Enc Vitals Group     BP 04/25/17 1311 138/74     Pulse Rate 04/25/17 1311 97     Resp 04/25/17 1311 16     Temp 04/25/17 1311 98.6 F (37 C)     Temp src --      SpO2 04/25/17 1311 100 %     Weight 04/25/17 1312 269 lb (122 kg)     Height 04/25/17 1312 6\' 1"  (1.854 m)  Head Circumference --      Peak Flow --      Pain Score 04/25/17 1313 1     Pain Loc --      Pain Edu? --      Excl. in [place]? --    Updated Vital Signs BP 138/74 (BP Location: Left Arm)   Pulse 97   Temp 98.6 F (37 C)   Resp 16   Ht 6\' 1"  (1.854 m)   Wt 269 lb (122 kg)   SpO2 100%   BMI 35.49 kg/m   Physical Exam  Constitutional: He is oriented to person, place, and time. He appears well-developed. No distress.  HENT:  Head: Normocephalic and atraumatic.  Eyes: Conjunctivae are normal. No scleral icterus.  Pulmonary/Chest: Effort normal. No respiratory distress.  Abdominal: Hernia confirmed negative in the right inguinal area and confirmed negative in the left inguinal area.  Genitourinary: Penis normal.  Genitourinary Comments: Right testicle - firm, ~1-1.5 cm mass noted. No redness.   Musculoskeletal: Normal range of motion.    Neurological: He is alert and oriented to person, place, and time.  Psychiatric: He has a normal mood and affect.  Vitals reviewed.  UC Treatments / Results  Labs (all labs ordered are listed, but only abnormal results are displayed) Labs Reviewed - No data to display  EKG  EKG Interpretation None       Radiology No results found.  Procedures Procedures (including critical care time)  Medications Ordered in UC Medications - No data to display   Initial Impression / Assessment and Plan / UC Course  I have reviewed the triage vital signs and the nursing notes.  Pertinent labs & imaging results that were available during my care of the patient were reviewed by me and considered in my medical decision making (see chart for details).   46 year old male presents with a testicular mass. No evidence of epididymitis. I do not suspect hydrocele or varicocele. Uncertain etiology at this time. Proceeding with ultrasound.  Final Clinical Impressions(s) / UC Diagnoses   Final diagnoses:  Mass of right testicle    New Prescriptions Discharge Medication List as of 04/25/2017  1:33 PM     Controlled Substance Prescriptions [place] Controlled Substance Registry consulted? Not Applicable   Coral Spikes, DO 04/25/17 1402

## 2017-04-25 NOTE — Discharge Instructions (Signed)
We will let you know the results.  Take care  Dr. Lacinda Axon

## 2017-05-09 ENCOUNTER — Ambulatory Visit (INDEPENDENT_AMBULATORY_CARE_PROVIDER_SITE_OTHER): Payer: BLUE CROSS/BLUE SHIELD | Admitting: Family Medicine

## 2017-05-09 ENCOUNTER — Encounter: Payer: Self-pay | Admitting: Family Medicine

## 2017-05-09 VITALS — BP 132/88 | HR 78 | Ht 72.0 in | Wt 266.6 lb

## 2017-05-09 DIAGNOSIS — N503 Cyst of epididymis: Secondary | ICD-10-CM | POA: Diagnosis not present

## 2017-05-09 DIAGNOSIS — F17200 Nicotine dependence, unspecified, uncomplicated: Secondary | ICD-10-CM

## 2017-05-09 DIAGNOSIS — E785 Hyperlipidemia, unspecified: Secondary | ICD-10-CM | POA: Diagnosis not present

## 2017-05-09 DIAGNOSIS — Z8249 Family history of ischemic heart disease and other diseases of the circulatory system: Secondary | ICD-10-CM

## 2017-05-09 DIAGNOSIS — Z0000 Encounter for general adult medical examination without abnormal findings: Secondary | ICD-10-CM

## 2017-05-09 DIAGNOSIS — K219 Gastro-esophageal reflux disease without esophagitis: Secondary | ICD-10-CM

## 2017-05-09 DIAGNOSIS — Z1283 Encounter for screening for malignant neoplasm of skin: Secondary | ICD-10-CM | POA: Diagnosis not present

## 2017-05-09 HISTORY — DX: Cyst of epididymis: N50.3

## 2017-05-09 LAB — COMPREHENSIVE METABOLIC PANEL
ALT: 43 U/L (ref 0–53)
AST: 24 U/L (ref 0–37)
Albumin: 4.5 g/dL (ref 3.5–5.2)
Alkaline Phosphatase: 50 U/L (ref 39–117)
BUN: 13 mg/dL (ref 6–23)
CO2: 30 meq/L (ref 19–32)
Calcium: 9.4 mg/dL (ref 8.4–10.5)
Chloride: 102 mEq/L (ref 96–112)
Creatinine, Ser: 0.85 mg/dL (ref 0.40–1.50)
GFR: 103.45 mL/min (ref >60.00)
GLUCOSE: 104 mg/dL — AB (ref 70–99)
POTASSIUM: 4.1 meq/L (ref 3.5–5.1)
SODIUM: 139 meq/L (ref 135–145)
Total Bilirubin: 0.8 mg/dL (ref 0.2–1.2)
Total Protein: 6.7 g/dL (ref 6.0–8.3)

## 2017-05-09 LAB — POC URINALSYSI DIPSTICK (AUTOMATED)
BILIRUBIN UA: NEGATIVE
Blood, UA: NEGATIVE
Glucose, UA: NEGATIVE
Ketones, UA: NEGATIVE
Leukocytes, UA: NEGATIVE
Nitrite, UA: NEGATIVE
PH UA: 6 (ref 5.0–8.0)
Protein, UA: 15
SPEC GRAV UA: 1.025 (ref 1.010–1.025)
Urobilinogen, UA: 0.2 E.U./dL

## 2017-05-09 LAB — LIPID PANEL
CHOL/HDL RATIO: 5
Cholesterol: 140 mg/dL (ref 0–200)
HDL: 29.4 mg/dL — AB (ref >39.00)
LDL CALC: 78 mg/dL (ref 0–99)
NONHDL: 110.95
Triglycerides: 163 mg/dL — ABNORMAL HIGH (ref 0.0–149.0)
VLDL: 32.6 mg/dL (ref 0.0–40.0)

## 2017-05-09 LAB — CBC
HEMATOCRIT: 45.7 % (ref 39.0–52.0)
HEMOGLOBIN: 16.1 g/dL (ref 13.0–17.0)
MCHC: 35.1 g/dL (ref 30.0–36.0)
MCV: 94.1 fl (ref 78.0–100.0)
PLATELETS: 196 10*3/uL (ref 150.0–400.0)
RBC: 4.86 Mil/uL (ref 4.22–5.81)
RDW: 12.4 % (ref 11.5–15.5)
WBC: 6 10*3/uL (ref 4.0–10.5)

## 2017-05-09 MED ORDER — ATORVASTATIN CALCIUM 20 MG PO TABS: ORAL_TABLET | ORAL | 3 refills | Status: DC

## 2017-05-09 NOTE — Assessment & Plan Note (Signed)
GERD- with long term PPI use, check b12 if cannot get off. Wants to trial zantac after discussion

## 2017-05-09 NOTE — Assessment & Plan Note (Signed)
Felt mass in scrotum- saw urgent care "1. No testicular mass or specific findings of torsion or orchitis. 2. Single bilateral epididymal cysts or spermatoceles, 1.2 cm on the right and 0.5 cm on the left. 3. Small bilateral hydroceles."

## 2017-05-09 NOTE — Progress Notes (Signed)
Phone: [phone]  Subjective:  Patient presents today for their annual physical. Chief complaint-noted.   See problem oriented charting- ROS- full  review of systems was completed and negative except for: nodule in scrotum- evaluated at urgent care, seasonal allergies. Still has hernia left groin- still pops right back in   The following were reviewed and entered/updated in epic: Past Medical History:  Diagnosis Date  . Allergic rhinitis    otc flonase  . GERD (gastroesophageal reflux disease)    prilosec- hiatal hernia  . Hiatal hernia   . Obesity    Patient Active Problem List   Diagnosis Date Noted  . Smoker 05/12/2015    Priority: High  . Family history of premature CAD 05/12/2015    Priority: High  . Hyperlipidemia 11/10/2015    Priority: Medium  . Left groin hernia 11/10/2015    Priority: Medium  . Obesity     Priority: Medium  . Epididymal cyst 05/09/2017    Priority: Low  . Allergic rhinitis     Priority: Low  . GERD (gastroesophageal reflux disease)     Priority: Low   Past Surgical History:  Procedure Laterality Date  . WISDOM TOOTH EXTRACTION     general anesthesia    Family History  Problem Relation Age of Onset  . Arthritis Mother   . Diabetes Mother        grandmother  . CAD Father   . Sudden death Father        heart attack 41- constant stress, poor food choices, smoked heavier than patient    Medications- reviewed and updated Current Outpatient Prescriptions  Medication Sig Dispense Refill  . atorvastatin (LIPITOR) 20 MG tablet TAKE 1 TABLET BY MOUTH ONCE DAILY AT 6PM 30 tablet 0  . fluticasone (FLONASE) 50 MCG/ACT nasal spray Place into both nostrils as needed.     . Omeprazole Magnesium (PRILOSEC OTC PO) Take by mouth.     No current facility-administered medications for this visit.     Allergies-reviewed and updated No Known Allergies  Social History   Social History  . Marital status: Single    Spouse name: N/A  . Number  of children: N/A  . Years of education: N/A   Social History Main Topics  . Smoking status: Current Every Day Smoker    Packs/day: 0.50    Types: Cigarettes  . Smokeless tobacco: Never Used  . Alcohol use 1.2 - 3.0 oz/week    2 - 5 Standard drinks or equivalent per week  . Drug use: Yes     Comment: rare marijuana  . Sexual activity: Not Asked   Other Topics Concern  . None   Social History Narrative   Family: Single. Lives with roommate.       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 No college      Hobbies: time at gym usually 4-5 days a week, friends and family, vacation    Objective: BP 132/88 (BP Location: Left Arm, Patient Position: Sitting, Cuff Size: Large)   Pulse 78   Ht 6' (1.829 m)   Wt 266 lb 9.6 oz (120.9 kg)   SpO2 97%   BMI 36.16 kg/m  Gen: NAD, resting comfortably HEENT: Mucous membranes are moist. Oropharynx normal Neck: no thyromegaly CV: RRR no murmurs rubs or gallops Lungs: CTAB no crackles, wheeze, rhonchi Abdomen: soft/nontender/nondistended/normal bowel sounds. No rebound or guarding. obese Ext: no edema Skin: warm,  dry Neuro: grossly normal, moves all extremities, PERRLA Did not reexamine hernia today  Assessment/Plan:  46 y.o. male presenting for annual physical.  Health Maintenance counseling: 1. Anticipatory guidance: Patient counseled regarding regular dental exams q6 months, eye exams - wants to get this updated, wearing seatbelts.  2. Risk factor reduction:  Advised patient of need for regular exercise and diet rich and fruits and vegetables to reduce risk of heart attack and stroke. \ A few visits ago  set goal of getting down to 215- unfortunately has swung up and now at near 270. Some of this was muscle gain but then states got a promotion and was unable to eat or exercise as much.  Wt Readings from Last 3 Encounters:  05/09/17 266 lb 9.6 oz (120.9 kg)  04/25/17 269 lb  (122 kg)  12/25/16 240 lb (108.9 kg)  3. Immunizations/screenings/ancillary studies- flu shot yesterday at work Immunization History  Administered Date(s) Administered  . Tdap 11/10/2015  4. Prostate cancer screening-  No early family history, start at age 40. Grandfather had in late 40s or 25s.   5. Colon cancer screening - no family history, start at age 29 (unless all bodies move to age 67 as does insurance coverage) 6. Skin cancer screening- sees dermatology yearly- refer to new dermatologist per preference. advised regular sunscreen use. Denies worrisome, changing, or new skin lesions.  7. STD screening- active with same partner- declines STD screening right now  Status of chronic or acute concerns   2 med questions- PPI and kidneys, and blood sugar and statin. See below- trial off PPI. Discussed bigger risk to CBGs is his weight and discussed weight loss  Allergic rhinitis- has been using flonase. Feels more aggressive. Discussed alternate like zyrtec or claritin at night  Left groin hernia- still present. Declines surgery for now  Hyperlipidemia HLD- compliant with atorvastatin 20mg  every other day at least-sometimes more frequent.  Update lipids Lab Results  Component Value Date   CHOL 164 01/26/2016   HDL 31.80 (L) 01/26/2016   LDLCALC 106 (H) 01/26/2016   LDLDIRECT 125.0 01/26/2016   TRIG 131.0 01/26/2016   CHOLHDL 5 01/26/2016     Smoker Smoker- last visit 2016 down to 1/2 PPD. Family history of premature CAD so strongly needs to quit. Father died of MI at age 46. Has cut down to 8-10 cigarettes a day  Encouraged full cessation  Family history of premature CAD Saw cardiology 12/16/15 who did stress test and no ischemia noted  Epididymal cyst Felt mass in scrotum- saw urgent care "1. No testicular mass or specific findings of torsion or orchitis. 2. Single bilateral epididymal cysts or spermatoceles, 1.2 cm on the right and 0.5 cm on the left. 3. Small bilateral  hydroceles."  GERD (gastroesophageal reflux disease) GERD- with long term PPI use, check b12 if cannot get off. Wants to trial zantac after discussion  1 year CPE  Orders Placed This Encounter  Procedures  . CBC    [place]  . Comprehensive metabolic panel    [place]    Order Specific Question:   Has the patient fasted?    Answer:   No  . Lipid panel    [place]    Order Specific Question:   Has the patient fasted?    Answer:   No  . Ambulatory referral to Dermatology    Referral Priority:   Routine    Referral Type:   Consultation    Referral Reason:   Specialty Services Required  Requested Specialty:   Dermatology    Number of Visits Requested:   1  . POCT Urinalysis Dipstick (Automated)    Meds ordered this encounter  Medications  . atorvastatin (LIPITOR) 20 MG tablet    Sig: TAKE 1 TABLET BY MOUTH ONCE DAILY AT 6PM    Dispense:  90 tablet    Refill:  3  trial zantac instead of PPI  Return precautions advised.  Garret Reddish, MD

## 2017-05-09 NOTE — Assessment & Plan Note (Signed)
HLD- compliant with atorvastatin 20mg  every other day at least-sometimes more frequent.  Update lipids Lab Results  Component Value Date   CHOL 164 01/26/2016   HDL 31.80 (L) 01/26/2016   LDLCALC 106 (H) 01/26/2016   LDLDIRECT 125.0 01/26/2016   TRIG 131.0 01/26/2016   CHOLHDL 5 01/26/2016

## 2017-05-09 NOTE — Assessment & Plan Note (Signed)
Saw cardiology 12/16/15 who did stress test and no ischemia noted

## 2017-05-09 NOTE — Patient Instructions (Addendum)
Trial zantac 150mg  twice a day with meals instead of prilosec  Love your weight goal of 215- glad you are in a better place to start moving towards that  Strongly advise quitting smoking- glad this is on your mind.   Please stop by lab before you go

## 2017-05-09 NOTE — Assessment & Plan Note (Signed)
Smoker- last visit 2016 down to 1/2 PPD. Family history of premature CAD so strongly needs to quit. Father died of MI at age 45. Has cut down to 8-10 cigarettes a day  Encouraged full cessation

## 2018-05-10 ENCOUNTER — Encounter: Payer: Self-pay | Admitting: Family Medicine

## 2018-05-10 ENCOUNTER — Ambulatory Visit (INDEPENDENT_AMBULATORY_CARE_PROVIDER_SITE_OTHER): Payer: BLUE CROSS/BLUE SHIELD | Admitting: Family Medicine

## 2018-05-10 VITALS — BP 128/82 | HR 88 | Temp 97.6°F | Ht 72.0 in | Wt 268.8 lb

## 2018-05-10 DIAGNOSIS — E785 Hyperlipidemia, unspecified: Secondary | ICD-10-CM

## 2018-05-10 DIAGNOSIS — Z0000 Encounter for general adult medical examination without abnormal findings: Secondary | ICD-10-CM | POA: Diagnosis not present

## 2018-05-10 DIAGNOSIS — Z6836 Body mass index (BMI) 36.0-36.9, adult: Secondary | ICD-10-CM

## 2018-05-10 DIAGNOSIS — N503 Cyst of epididymis: Secondary | ICD-10-CM

## 2018-05-10 DIAGNOSIS — Z125 Encounter for screening for malignant neoplasm of prostate: Secondary | ICD-10-CM | POA: Diagnosis not present

## 2018-05-10 DIAGNOSIS — K4090 Unilateral inguinal hernia, without obstruction or gangrene, not specified as recurrent: Secondary | ICD-10-CM

## 2018-05-10 DIAGNOSIS — F17200 Nicotine dependence, unspecified, uncomplicated: Secondary | ICD-10-CM

## 2018-05-10 DIAGNOSIS — K219 Gastro-esophageal reflux disease without esophagitis: Secondary | ICD-10-CM

## 2018-05-10 DIAGNOSIS — J301 Allergic rhinitis due to pollen: Secondary | ICD-10-CM

## 2018-05-10 NOTE — Assessment & Plan Note (Signed)
Stable on self exams.

## 2018-05-10 NOTE — Assessment & Plan Note (Signed)
using atorvastatin at least every other day if not more frequently.  Update lipids.  With family history discussed being more aggressive in trying to push LDL under 70 to prevent heart disease- he agrees to be more consistent. Saw cardiology 12/16/15 who did stress test and no ischemia noted

## 2018-05-10 NOTE — Patient Instructions (Addendum)
Please stop by lab before you go  Send me a message if issues with finger worsens- we can get you into Dr. Paulla Fore of sports medicine to consider injection  Lets set a goal of walking on treadmill 15 minutes 3 days a week in the morning for 1 month.   I like your long term goal of 1 hour 4-5 days a week but using a shorter less aggressive goal is a good entryway toward that goal.   Thanks for doing your flu shot!   I would like for you to use an app like calorie king or myfitness pal and track your calories for 2 weeks- then I want you to cut down by 200 calories per day

## 2018-05-10 NOTE — Progress Notes (Signed)
Phone: [phone]  Subjective:  Patient presents today for their annual physical. Chief complaint-noted.   See problem oriented charting- ROS- full  review of systems was completed and negative except for: joint swelling, neck pain  The following were reviewed and entered/updated in epic: Past Medical History:  Diagnosis Date  . Allergic rhinitis    otc flonase  . GERD (gastroesophageal reflux disease)    prilosec- hiatal hernia  . Hiatal hernia   . Obesity    Patient Active Problem List   Diagnosis Date Noted  . Smoker 05/12/2015    Priority: High  . Family history of premature CAD 05/12/2015    Priority: High  . Hyperlipidemia 11/10/2015    Priority: Medium  . Left groin hernia 11/10/2015    Priority: Medium  . Obesity     Priority: Medium  . Epididymal cyst 05/09/2017    Priority: Low  . Allergic rhinitis     Priority: Low  . GERD (gastroesophageal reflux disease)     Priority: Low   Past Surgical History:  Procedure Laterality Date  . WISDOM TOOTH EXTRACTION     general anesthesia    Family History  Problem Relation Age of Onset  . Arthritis Mother   . Diabetes Mother        grandmother  . CAD Father   . Sudden death Father        heart attack 50- constant stress, poor food choices, smoked heavier than patient    Medications- reviewed and updated Current Outpatient Medications  Medication Sig Dispense Refill  . atorvastatin (LIPITOR) 20 MG tablet TAKE 1 TABLET BY MOUTH ONCE DAILY AT 6PM 90 tablet 3  . fluticasone (FLONASE) 50 MCG/ACT nasal spray Place into both nostrils as needed.     . Omeprazole Magnesium (PRILOSEC OTC PO) Take by mouth.     No current facility-administered medications for this visit.     Allergies-reviewed and updated No Known Allergies  Social History   Social History Narrative   Family: Single- not dating lately. Living alone now.       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 No college      Hobbies: time at gym usually 4-5 days a week, friends and family, vacation    Objective: BP 128/82 (BP Location: Left Arm, Patient Position: Sitting, Cuff Size: Large)   Pulse 88   Temp 97.6 F (36.4 C) (Oral)   Ht 6' (1.829 m)   Wt 268 lb 12.8 oz (121.9 kg)   SpO2 96%   BMI 36.46 kg/m  Gen: NAD, resting comfortably, smells of smoke HEENT: Mucous membranes are moist. Oropharynx normal Neck: no thyromegaly CV: RRR no murmurs rubs or gallops Lungs: CTAB no crackles, wheeze, rhonchi Abdomen: soft/nontender/nondistended/normal bowel sounds. No rebound or guarding.  Ext: no edema Skin: warm, dry Neuro: grossly normal, moves all extremities, PERRLA Rectal: normal tone, normal sized prostate, no masses or tenderness  Assessment/Plan:  47 y.o. male presenting for annual physical.  Health Maintenance counseling: 1. Anticipatory guidance: Patient counseled regarding regular dental exams q6 months, eye exams - no issues, wearing seatbelts.  2. Risk factor reduction:  Advised patient of need for regular exercise and diet rich and fruits and vegetables to reduce risk of heart attack and stroke. Exercise- has been low lately- feels motivation is low- working a lot of hours. Diet-he has had a long-term weight goal of getting to 215- states really  eats poorly on weekend but tries to eat well in the week.  Wt Readings from Last 3 Encounters:  05/10/18 268 lb 12.8 oz (121.9 kg)  05/09/17 266 lb 9.6 oz (120.9 kg)  04/25/17 269 lb (122 kg)  3. Immunizations/screenings/ancillary studies-flu shot at work  Immunization History  Administered Date(s) Administered  . Influenza-Unspecified 04/30/2017, 04/22/2018  . Tdap 11/10/2015  4. Prostate cancer screening-grandfather with prostate cancer in late 15s or early 60s.  We will go ahead and get a baseline PSA today and rectal exam- normal exam  5. Colon cancer screening - no family history, start at age 39 likely 3.  Skin cancer screening/prevention-sees dermatology yearly. advised regular sunscreen use. Denies worrisome, changing, or new skin lesions.  7. Testicular cancer screening- advised monthly self exams- same cyst is stable 8. STD screening- patient opts out- declines screening. Asymptomatic right now.  9.  Smoker- was down to 8 to 10 cigarettes/day last year, now at 10-12 per day (mostly in car or while drinking on weekend).  Father died of heart attack at age 74.  We have strongly encourage cessation and did again today. Also when he exercises, feels like it helps him focus on being healthier and may help him smoke less- encouraged this.   Status of chronic or acute concerns   Trigger finger Noting some joint popping in left 5th MCP- first time he moves in morning is painful and can get clicking feeling. Also sparing other finger on right hand. Examination shows trigger finger- only mildly bothersome. Better for him when using more. Declines nsaids. May consider sports medicine referral if worsens  Had some right neck tension with stress at work- tightening in neck- rubbing would help it- by time he would lie down would feel better. Now resolved.   Smoker Encourage cessation-not ready to quit.  Wants to continue to work cutting down  Hyperlipidemia using atorvastatin at least every other day if not more frequently.  Update lipids.  With family history discussed being more aggressive in trying to push LDL under 70 to prevent heart disease- he agrees to be more consistent. Saw cardiology 12/16/15 who did stress test and no ischemia noted  Left groin hernia remains present, declines surgery consult for now.   Allergic rhinitis Allergic rhinitis- prn flonase helpful- using short term and doesn't get the aggressive feeling he gets with long term use  GERD (gastroesophageal reflux disease) GERD- last year he was to trial Zantac instead of PPI due to his concern about kidney risks- he bought but never  took. Has moved omeprazole to every few days- reasonable control with this.   Epididymal cyst Stable on self exams.  1 year physical   Lab/Order associations: Preventative health care - Plan: Lipid panel, CBC, Comprehensive metabolic panel, Urinalysis, PSA  Hyperlipidemia, unspecified hyperlipidemia type - Plan: Lipid panel, CBC, Comprehensive metabolic panel, Urinalysis  Smoker  Screening for prostate cancer - Plan: PSA  Body mass index (BMI) of 36.0-36.9 in adult  Return precautions advised.  Garret Reddish, MD

## 2018-05-10 NOTE — Assessment & Plan Note (Signed)
Encourage cessation-not ready to quit.  Wants to continue to work cutting down

## 2018-05-10 NOTE — Assessment & Plan Note (Signed)
Allergic rhinitis- prn flonase helpful- using short term and doesn't get the aggressive feeling he gets with long term use

## 2018-05-10 NOTE — Assessment & Plan Note (Signed)
remains present, declines surgery consult for now.

## 2018-05-10 NOTE — Assessment & Plan Note (Signed)
GERD- last year he was to trial Zantac instead of PPI due to his concern about kidney risks- he bought but never took. Has moved omeprazole to every few days- reasonable control with this.

## 2018-05-20 ENCOUNTER — Other Ambulatory Visit: Payer: BLUE CROSS/BLUE SHIELD

## 2018-05-28 ENCOUNTER — Other Ambulatory Visit: Payer: Self-pay

## 2018-05-28 MED ORDER — ATORVASTATIN CALCIUM 20 MG PO TABS: ORAL_TABLET | ORAL | 3 refills | Status: DC

## 2019-05-16 ENCOUNTER — Encounter: Payer: BLUE CROSS/BLUE SHIELD | Admitting: Family Medicine

## 2019-06-27 ENCOUNTER — Other Ambulatory Visit: Payer: Self-pay

## 2019-06-30 ENCOUNTER — Ambulatory Visit (INDEPENDENT_AMBULATORY_CARE_PROVIDER_SITE_OTHER): Payer: Managed Care, Other (non HMO) | Admitting: Family Medicine

## 2019-06-30 ENCOUNTER — Encounter: Payer: Self-pay | Admitting: Family Medicine

## 2019-06-30 VITALS — BP 130/82 | HR 83 | Temp 98.0°F | Ht 72.0 in | Wt 265.0 lb

## 2019-06-30 DIAGNOSIS — K4090 Unilateral inguinal hernia, without obstruction or gangrene, not specified as recurrent: Secondary | ICD-10-CM

## 2019-06-30 DIAGNOSIS — Z8249 Family history of ischemic heart disease and other diseases of the circulatory system: Secondary | ICD-10-CM | POA: Diagnosis not present

## 2019-06-30 DIAGNOSIS — F17200 Nicotine dependence, unspecified, uncomplicated: Secondary | ICD-10-CM | POA: Diagnosis not present

## 2019-06-30 DIAGNOSIS — Z125 Encounter for screening for malignant neoplasm of prostate: Secondary | ICD-10-CM | POA: Diagnosis not present

## 2019-06-30 DIAGNOSIS — Z1211 Encounter for screening for malignant neoplasm of colon: Secondary | ICD-10-CM

## 2019-06-30 DIAGNOSIS — Z8371 Family history of colonic polyps: Secondary | ICD-10-CM

## 2019-06-30 DIAGNOSIS — E785 Hyperlipidemia, unspecified: Secondary | ICD-10-CM

## 2019-06-30 DIAGNOSIS — Z0000 Encounter for general adult medical examination without abnormal findings: Secondary | ICD-10-CM

## 2019-06-30 DIAGNOSIS — K219 Gastro-esophageal reflux disease without esophagitis: Secondary | ICD-10-CM

## 2019-06-30 LAB — LIPID PANEL
Cholesterol: 168 mg/dL (ref 0–200)
HDL: 32.4 mg/dL — ABNORMAL LOW (ref >39.00)
LDL Cholesterol: 123 mg/dL — ABNORMAL HIGH (ref 0–99)
NonHDL: 135.96
Total CHOL/HDL Ratio: 5
Triglycerides: 67 mg/dL (ref 0.0–149.0)
VLDL: 13.4 mg/dL (ref 0.0–40.0)

## 2019-06-30 LAB — POC URINALSYSI DIPSTICK (AUTOMATED)
Bilirubin, UA: NEGATIVE
Blood, UA: NEGATIVE
Glucose, UA: POSITIVE — AB
Ketones, UA: NEGATIVE
Leukocytes, UA: NEGATIVE
Nitrite, UA: NEGATIVE
Protein, UA: NEGATIVE
Spec Grav, UA: 1.02 (ref 1.010–1.025)
Urobilinogen, UA: 1 E.U./dL
pH, UA: 6 (ref 5.0–8.0)

## 2019-06-30 LAB — CBC WITH DIFFERENTIAL/PLATELET
Basophils Absolute: 0 10*3/uL (ref 0.0–0.1)
Basophils Relative: 0.4 % (ref 0.0–3.0)
Eosinophils Absolute: 0.1 10*3/uL (ref 0.0–0.7)
Eosinophils Relative: 1.9 % (ref 0.0–5.0)
HCT: 44 % (ref 39.0–52.0)
Hemoglobin: 15.3 g/dL (ref 13.0–17.0)
Lymphocytes Relative: 43.6 % (ref 12.0–46.0)
Lymphs Abs: 2.5 10*3/uL (ref 0.7–4.0)
MCHC: 34.8 g/dL (ref 30.0–36.0)
MCV: 94.6 fl (ref 78.0–100.0)
Monocytes Absolute: 0.5 10*3/uL (ref 0.1–1.0)
Monocytes Relative: 8.2 % (ref 3.0–12.0)
Neutro Abs: 2.7 10*3/uL (ref 1.4–7.7)
Neutrophils Relative %: 45.9 % (ref 43.0–77.0)
Platelets: 192 10*3/uL (ref 150.0–400.0)
RBC: 4.66 Mil/uL (ref 4.22–5.81)
RDW: 12.8 % (ref 11.5–15.5)
WBC: 5.8 10*3/uL (ref 4.0–10.5)

## 2019-06-30 LAB — COMPREHENSIVE METABOLIC PANEL
ALT: 33 U/L (ref 0–53)
AST: 28 U/L (ref 0–37)
Albumin: 4.4 g/dL (ref 3.5–5.2)
Alkaline Phosphatase: 55 U/L (ref 39–117)
BUN: 17 mg/dL (ref 6–23)
CO2: 27 mEq/L (ref 19–32)
Calcium: 8.9 mg/dL (ref 8.4–10.5)
Chloride: 105 mEq/L (ref 96–112)
Creatinine, Ser: 0.82 mg/dL (ref 0.40–1.50)
GFR: 100.51 mL/min (ref >60.00)
Glucose, Bld: 152 mg/dL — ABNORMAL HIGH (ref 70–99)
Potassium: 3.8 mEq/L (ref 3.5–5.1)
Sodium: 138 mEq/L (ref 135–145)
Total Bilirubin: 1 mg/dL (ref 0.2–1.2)
Total Protein: 6.6 g/dL (ref 6.0–8.3)

## 2019-06-30 LAB — PSA: PSA: 0.22 ng/mL (ref 0.10–4.00)

## 2019-06-30 MED ORDER — ATORVASTATIN CALCIUM 20 MG PO TABS: ORAL_TABLET | ORAL | 3 refills | Status: DC

## 2019-06-30 NOTE — Patient Instructions (Addendum)
Great job trying to cut down on cigarettes. Quitting is one of the best things you can do for health.   Great job losing 3 lbs this year- lets focus on a minimum of another 3-10 over next year  If you want a surgical referral for consult on hernia- dont hesitate to reach out- mychart is fine for that.  We will call you within two weeks about your referral to GI. If you do not hear within 3 weeks, give [place] a call.   Please stop by lab before you go If you do not have mychart- we will call you about results within 5 business days of [place] receiving them.  If you have mychart- we will send your results within 3 business days of [place] receiving them.  If abnormal or we want to clarify a result, we will call or mychart you to make sure you receive the message.  If you have questions or concerns or don't hear within 5-7 days, please send [place] a message or call us.   Recommended follow up: Return in about 1 year (around 06/29/2020) for physical or sooner if needed.

## 2019-06-30 NOTE — Addendum Note (Signed)
Addended by: Francis Dowse T on: 06/30/2019 08:47 AM   Modules accepted: Orders

## 2019-06-30 NOTE — Progress Notes (Signed)
Phone: [phone]    Subjective:  Patient presents today for their annual physical. Chief complaint-noted.   See problem oriented charting- Review of Systems  Constitutional: Negative.   HENT: Positive for ear pain.        Had black head in left ear. Better now.   Eyes: Negative.   Respiratory: Negative.   Cardiovascular: Negative.   Gastrointestinal: Negative.   Genitourinary: Negative.   Musculoskeletal: Negative.   Skin: Negative.   Neurological: Negative.   Endo/Heme/Allergies: Negative.   Psychiatric/Behavioral: Negative.     The following were reviewed and entered/updated in epic: Past Medical History:  Diagnosis Date  . Allergic rhinitis    otc flonase  . GERD (gastroesophageal reflux disease)    prilosec- hiatal hernia  . Hiatal hernia   . Obesity    Patient Active Problem List   Diagnosis Date Noted  . Smoker 05/12/2015    Priority: High  . Family history of premature CAD 05/12/2015    Priority: High  . Hyperlipidemia 11/10/2015    Priority: Medium  . Left groin hernia 11/10/2015    Priority: Medium  . Obesity     Priority: Medium  . Epididymal cyst 05/09/2017    Priority: Low  . Allergic rhinitis     Priority: Low  . GERD (gastroesophageal reflux disease)     Priority: Low   Past Surgical History:  Procedure Laterality Date  . WISDOM TOOTH EXTRACTION     general anesthesia    Family History  Problem Relation Age of Onset  . Arthritis Mother   . Diabetes Mother        grandmother  . CAD Father   . Sudden death Father        heart attack 39- constant stress, poor food choices, smoked heavier than patient  . Colon polyps Sister        11 sessile serrated polyps.   . Colon cancer Paternal Grandfather        pretty certain    Medications- reviewed and updated Current Outpatient Medications  Medication Sig Dispense Refill  . atorvastatin (LIPITOR) 20 MG tablet TAKE 1 TABLET BY MOUTH ONCE DAILY AT 6PM 90 tablet 3  . fluticasone  (FLONASE) 50 MCG/ACT nasal spray Place into both nostrils as needed.     . Omeprazole Magnesium (PRILOSEC OTC PO) Take by mouth.     No current facility-administered medications for this visit.    Allergies-reviewed and updated No Known Allergies  Social History   Social History Narrative   Family: Single- not dating lately. Living alone now. 2020- got new poodle just before Covid 19.       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 No college      Hobbies: time at gym usually 4-5 days a week, friends and family, vacation      Objective:  BP 130/82   Pulse 83   Temp 98 F (36.7 C) (Temporal)   Ht 6' (1.829 m)   Wt 265 lb (120.2 kg)   SpO2 96%   BMI 35.94 kg/m  Gen: NAD, resting comfortably HEENT: Mask not removed due to covid 19. TM normal. Bridge of nose normal. Eyelids normal.  Neck: no thyromegaly or cervical lymphadenopathy  CV: RRR no murmurs rubs or gallops Lungs: CTAB no crackles, wheeze, rhonchi Abdomen: soft/nontender/nondistended/normal bowel sounds. No rebound or guarding.  Ext: no edema Skin: warm, dry Neuro:  grossly normal, moves all extremities, PERRLA GU: cyst stable on right side. Decent size hernia on the left stable.     Assessment and Plan:  48 y.o. male presenting for annual physical.  Health Maintenance counseling: 1. Anticipatory guidance: Patient counseled regarding regular dental exams q6 months, eye exams -He has not had recent eye exam perhaps 1`5 years ago- we discussed potentially updating before age 34 even if no issues,  Not avoiding smoking and second hand smoke. Patient is current smoker.  Limiting alcohol to 2 beverages per day. He does not drink but once a month, but will have more than two at that time.    2. Risk factor reduction:  Advised patient of need for regular exercise and diet rich and fruits and vegetables to reduce risk of heart attack and  stroke. Exercise- Has new dog that he walks with daily. He has not been going to the gym due to covid. Diet-works on portion control and healthy options. Doesn't feel comfortable with gym yet. This is hard to do with his work schedule. Working 6 days a week has been challenging- slightly better since thanksgiving.   Wt Readings from Last 3 Encounters:  06/30/19 265 lb (120.2 kg)  05/10/18 268 lb 12.8 oz (121.9 kg)  05/09/17 266 lb 9.6 oz (120.9 kg)  3. Immunizations/screenings/ancillary studies- influenza shot already given. Willing to get covid 19 vaccine.  Immunization History  Administered Date(s) Administered  . Influenza Whole 05/03/2019  . Influenza-Unspecified 04/30/2017, 04/22/2018  . Tdap 11/10/2015  4. Prostate cancer screening- grandfather with prostate cancer in late 73s or early 49s.  Plan last year was to get a baseline PSA-patient did not come back for labs-we will update today. Rectal exam low risk last year.   5. Colon cancer screening - discussed newer guidelines recommending colon cancer screening starting at age 60. Patient did find out sister had sessile serrated polyps x 11 on first colonoscopy at 32 which gives [place] extra reason to go ahead and get this done- referral placed today.   6. Skin cancer screening/prevention-Followed by [facility] has an appointment coming up.advised regular sunscreen use. Denies worrisome, changing, or new skin lesions- other than ear spot 7. Testicular cancer screening- advised monthly self exams, Patient does monthly- Has stable cyst with no changes on his exam-previously ultrasounded  8. STD screening- patient opts out 9. Current  smoker-last year smoking 10 to 12 cigarettes/day.  With Family history of premature CAD at age 58 in his father strongly encourage cessation. Currently smoking between 5-10 a day- feels like covid has been motivating. Encouraged complete cessation. Prefers not to use meds. He is working to cut down on his own    Status of chronic or acute concerns   Hyperlipidemia-had a stress test December 16, 2015 with cardiology which was low risk.  Last year he was compliant with atorvastatin at least every other day at 20 mg-currently he is taking Atorvastatin Daily  .  We will update lipids/LDL today and with family history with target LDL under 70 Lab Results  Component Value Date   CHOL 140 05/09/2017   HDL 29.40 (L) 05/09/2017   LDLCALC 78 05/09/2017   LDLDIRECT 125.0 01/26/2016   TRIG 163.0 (H) 05/09/2017   CHOLHDL 5 05/09/2017    Left groin hernia-present but patient defers surgery for now- rarely bothers him and not worsening.   Gastroesophageal reflux disease-takes omeprazole sparingly.  Previously we had discussed using H2 blocker- takes about 4 days a week. Discussed  pepcid.   Epididymal cyst-stable on exam   Slight ear itching- had a blackhead there that he has messed with- more irritated. Slight excoriation discussed hydrocortisone or at least vaseline  Recommended follow up: Return in about 1 year (around 06/29/2020) for physical or sooner if needed.  Lab/Order associations: Not  fasting   ICD-10-CM   1. Preventative health care  Z00.00 POCT Urinalysis Dipstick (Automated)    CBC with Differential/Platelet    Comprehensive metabolic panel    Lipid panel  2. Family history of premature CAD  Z82.49   3. Smoker  F17.200 POCT Urinalysis Dipstick (Automated)  4. Hyperlipidemia, unspecified hyperlipidemia type  E78.5 CBC with Differential/Platelet    Comprehensive metabolic panel    Lipid panel  5. Left groin hernia  K40.90   6. Gastroesophageal reflux disease without esophagitis  K21.9   7. Screening for prostate cancer  Z12.5 PSA  8. Screen for colon cancer  Z12.11 GI/gastroenterology  9. Family history of colonic polyps  Z83.71 GI/gastroenterology    Meds ordered this encounter  Medications  . atorvastatin (LIPITOR) 20 MG tablet    Sig: TAKE 1 TABLET BY MOUTH ONCE DAILY AT 6PM     Dispense:  90 tablet    Refill:  3   Return precautions advised.  Garret Reddish, MD

## 2019-07-01 ENCOUNTER — Other Ambulatory Visit: Payer: Self-pay

## 2019-07-01 MED ORDER — ATORVASTATIN CALCIUM 40 MG PO TABS: 40.0000 mg | ORAL_TABLET | Freq: Every day | ORAL | 3 refills | Status: DC

## 2019-07-27 ENCOUNTER — Encounter: Payer: Self-pay | Admitting: Family Medicine

## 2019-08-07 ENCOUNTER — Encounter: Payer: Self-pay | Admitting: Gastroenterology

## 2019-08-18 ENCOUNTER — Ambulatory Visit (AMBULATORY_SURGERY_CENTER): Payer: Managed Care, Other (non HMO)

## 2019-08-18 ENCOUNTER — Other Ambulatory Visit: Payer: Self-pay

## 2019-08-18 VITALS — Ht 72.0 in | Wt 265.0 lb

## 2019-08-18 DIAGNOSIS — Z01818 Encounter for other preprocedural examination: Secondary | ICD-10-CM

## 2019-08-18 DIAGNOSIS — Z8371 Family history of colonic polyps: Secondary | ICD-10-CM

## 2019-08-18 MED ORDER — NA SULFATE-K SULFATE-MG SULF 17.5-3.13-1.6 GM/177ML PO SOLN: 1.0000 | Freq: Once | ORAL | 0 refills | Status: AC

## 2019-08-18 NOTE — Progress Notes (Signed)
No egg or soy allergy known to patient  No issues with past sedation with any surgeries  or procedures, no intubation problems  No diet pills per patient No home 02 use per patient  No blood thinners per patient  Pt denies issues with constipation  No A fib or A flutter  EMMI video sent to pt's e mail  SUPREP COUPON GIVEN  VIRTUAL VISIT  Due to the COVID-19 pandemic we are asking patients to follow these guidelines. Please only bring one care partner. Please be aware that your care partner may wait in the car in the parking lot or if they feel like they will be too hot to wait in the car, they may wait in the lobby on the 4th floor. All care partners are required to wear a mask the entire time (we do not have any that we can provide them), they need to practice social distancing, and we will do a Covid check for all patient's and care partners when you arrive. Also we will check their temperature and your temperature. If the care partner waits in their car they need to stay in the parking lot the entire time and we will call them on their cell phone when the patient is ready for discharge so they can bring the car to the front of the building. Also all patient's will need to wear a mask into building.

## 2019-08-21 ENCOUNTER — Encounter: Payer: Self-pay | Admitting: Gastroenterology

## 2019-08-27 ENCOUNTER — Other Ambulatory Visit: Payer: Self-pay | Admitting: Gastroenterology

## 2019-08-27 ENCOUNTER — Other Ambulatory Visit: Payer: Self-pay

## 2019-08-27 ENCOUNTER — Ambulatory Visit (INDEPENDENT_AMBULATORY_CARE_PROVIDER_SITE_OTHER): Payer: Managed Care, Other (non HMO)

## 2019-08-27 DIAGNOSIS — Z1159 Encounter for screening for other viral diseases: Secondary | ICD-10-CM

## 2019-08-27 LAB — SARS CORONAVIRUS 2 (TAT 6-24 HRS): SARS Coronavirus 2: NEGATIVE

## 2019-09-01 ENCOUNTER — Other Ambulatory Visit: Payer: Self-pay

## 2019-09-01 ENCOUNTER — Ambulatory Visit (AMBULATORY_SURGERY_CENTER): Payer: Managed Care, Other (non HMO) | Admitting: Gastroenterology

## 2019-09-01 ENCOUNTER — Encounter: Payer: Self-pay | Admitting: Gastroenterology

## 2019-09-01 VITALS — BP 152/87 | HR 77 | Temp 96.9°F | Resp 13 | Ht 72.0 in | Wt 265.0 lb

## 2019-09-01 DIAGNOSIS — Z8371 Family history of colonic polyps: Secondary | ICD-10-CM | POA: Diagnosis present

## 2019-09-01 DIAGNOSIS — Z1211 Encounter for screening for malignant neoplasm of colon: Secondary | ICD-10-CM | POA: Diagnosis not present

## 2019-09-01 DIAGNOSIS — K635 Polyp of colon: Secondary | ICD-10-CM | POA: Diagnosis not present

## 2019-09-01 DIAGNOSIS — D123 Benign neoplasm of transverse colon: Secondary | ICD-10-CM

## 2019-09-01 DIAGNOSIS — D125 Benign neoplasm of sigmoid colon: Secondary | ICD-10-CM

## 2019-09-01 DIAGNOSIS — D124 Benign neoplasm of descending colon: Secondary | ICD-10-CM

## 2019-09-01 DIAGNOSIS — Z83719 Family history of colon polyps, unspecified: Secondary | ICD-10-CM

## 2019-09-01 HISTORY — PX: COLONOSCOPY: SHX174

## 2019-09-01 MED ORDER — SODIUM CHLORIDE 0.9 % IV SOLN: 500.0000 mL | Freq: Once | INTRAVENOUS | Status: DC

## 2019-09-01 NOTE — Op Note (Signed)
[place] Patient Name: Jack Hall Procedure Date: 09/01/2019 11:18 AM MRN: [id number] Endoscopist: Ladene Artist , MD Age: 49 Referring MD:  Date of Birth: 1970/10/27 Gender: Male Account #: [id number] Procedure:                Colonoscopy Indications:              Colon cancer screening in patient at increased                            risk: Family history of 1st-degree relative with                            colon polyps before age 73 years Medicines:                Monitored Anesthesia Care Procedure:                Pre-Anesthesia Assessment:                           - Prior to the procedure, a History and Physical                            was performed, and patient medications and                            allergies were reviewed. The patient's tolerance of                            previous anesthesia was also reviewed. The risks                            and benefits of the procedure and the sedation                            options and risks were discussed with the patient.                            All questions were answered, and informed consent                            was obtained. Prior Anticoagulants: The patient has                            taken no previous anticoagulant or antiplatelet                            agents. ASA Grade Assessment: II - A patient with                            mild systemic disease. After reviewing the risks                            and benefits, the patient was deemed in  satisfactory condition to undergo the procedure.                           After obtaining informed consent, the colonoscope                            was passed under direct vision. Throughout the                            procedure, the patient's blood pressure, pulse, and                            oxygen saturations were monitored continuously. The                            Colonoscope was introduced  through the anus and                            advanced to the the cecum, identified by                            appendiceal orifice and ileocecal valve. The                            ileocecal valve, appendiceal orifice, and rectum                            were photographed. The quality of the bowel                            preparation was good. The colonoscopy was performed                            without difficulty. The patient tolerated the                            procedure well. Scope In: 11:30:08 AM Scope Out: 12:09:00 PM Scope Withdrawal Time: 0 hours 35 minutes 1 second  Total Procedure Duration: 0 hours 38 minutes 52 seconds  Findings:                 The perianal and digital rectal examinations were                            normal.                           A 18 mm polyp was found in the transverse colon.                            The polyp was sessile. The polyp was removed with a                            hot snare. Resection and retrieval were complete.  Seven sessile polyps were found in the transverse                            colon. The polyps were 6 to 9 mm in size. These                            polyps were removed with a cold snare. Resection                            and retrieval were complete.                           Eight sessile polyps were found in the descending                            colon. The polyps were 6 to 9 mm in size. These                            polyps were removed with a cold snare. Resection                            and retrieval were complete.                           Eleven sessile polyps were found in the sigmoid                            colon. The polyps were 6 to 8 mm in size. These                            polyps were removed with a cold snare. Resection                            and retrieval were complete.                           A 13 mm polyp was found in the sigmoid colon.  The                            polyp was pedunculated. The polyp was removed with                            a hot snare. Resection and retrieval were complete.                           - Multiple 3-5 mm polyps, appeared hyperplastic, in                            left colon not removed.                           Multiple small-mouthed diverticula were found in  the left colon. There was no evidence of                            diverticular bleeding.                           Internal hemorrhoids were found during                            retroflexion. The hemorrhoids were small and Grade                            I (internal hemorrhoids that do not prolapse).                           The exam was otherwise without abnormality on                            direct and retroflexion views. Complications:            No immediate complications. Estimated blood loss:                            None. Estimated Blood Loss:     Estimated blood loss: none. Impression:               - One 18 mm polyp in the transverse colon, removed                            with a hot snare. Resected and retrieved.                           - Seven 6 to 9 mm polyps in the transverse colon,                            removed with a cold snare. Resected and retrieved.                           - Eight 6 to 9 mm polyps in the descending colon,                            removed with a cold snare. Resected and retrieved.                           - Ten 6 to 8 mm polyps in the sigmoid colon,                            removed with a cold snare. Resected and retrieved.                           - One 13 mm polyp in the sigmoid colon, removed                            with a hot snare. Resected  and retrieved.                           - Multiple 3-5 mm left colon polyps, appeared                            hyperplastic, not removed.                           - Moderate diverticulosis in  the left colon.                           - Internal hemorrhoids.                           - The examination was otherwise normal on direct                            and retroflexion views. Recommendation:           - Repeat colonoscopy likely in 6 to 12 months for                            surveillance based on pathology results.                           - Patient has a contact number available for                            emergencies. The signs and symptoms of potential                            delayed complications were discussed with the                            patient. Return to normal activities tomorrow.                            Written discharge instructions were provided to the                            patient.                           - High fiber diet.                           - Continue present medications.                           - Await pathology results.                           - No aspirin, ibuprofen, naproxen, or other                            non-steroidal anti-inflammatory drugs for 2 weeks  after polyp removal. Ladene Artist, MD 09/01/2019 12:20:15 PM This report has been signed electronically.

## 2019-09-01 NOTE — Patient Instructions (Addendum)
NO ASPIRIN ,NAPROXEN,IBUPROFEN OR OTHER NON STEROIDAL ANTI INFLAMMATORY DRUGS FOR 2 WEEKS AFTER POLYP REMOVAL  Information on polyps ,diverticulosis,& high fiber diet given to you today   Await pathology results on polyps removed     YOU HAD AN ENDOSCOPIC PROCEDURE TODAY AT [facility]:   Refer to the procedure report that was given to you for any specific questions about what was found during the examination.  If the procedure report does not answer your questions, please call your gastroenterologist to clarify.  If you requested that your care partner not be given the details of your procedure findings, then the procedure report has been included in a sealed envelope for you to review at your convenience later.  YOU SHOULD EXPECT: Some feelings of bloating in the abdomen. Passage of more gas than usual.  Walking can help get rid of the air that was put into your GI tract during the procedure and reduce the bloating. If you had a lower endoscopy (such as a colonoscopy or flexible sigmoidoscopy) you may notice spotting of blood in your stool or on the toilet paper. If you underwent a bowel prep for your procedure, you may not have a normal bowel movement for a few days.  Please Note:  You might notice some irritation and congestion in your nose or some drainage.  This is from the oxygen used during your procedure.  There is no need for concern and it should clear up in a day or so.  SYMPTOMS TO REPORT IMMEDIATELY:   Following lower endoscopy (colonoscopy or flexible sigmoidoscopy):  Excessive amounts of blood in the stool  Significant tenderness or worsening of abdominal pains  Swelling of the abdomen that is new, acute  Fever of 100F or higher    For urgent or emergent issues, a gastroenterologist can be reached at any hour by calling [phone].   DIET:  We do recommend a small meal at first, but then you may proceed to your regular diet.  Drink plenty of  fluids but you should avoid alcoholic beverages for 24 hours.  ACTIVITY:  You should plan to take it easy for the rest of today and you should NOT DRIVE or use heavy machinery until tomorrow (because of the sedation medicines used during the test).    FOLLOW UP: Our staff will call the number listed on your records 48-72 hours following your procedure to check on you and address any questions or concerns that you may have regarding the information given to you following your procedure. If we do not reach you, we will leave a message.  We will attempt to reach you two times.  During this call, we will ask if you have developed any symptoms of COVID 19. If you develop any symptoms (ie: fever, flu-like symptoms, shortness of breath, cough etc.) before then, please call [phone].  If you test positive for Covid 19 in the 2 weeks post procedure, please call and report this information to [place].    If any biopsies were taken you will be contacted by phone or by letter within the next 1-3 weeks.  Please call us at [phone] if you have not heard about the biopsies in 3 weeks.    SIGNATURES/CONFIDENTIALITY: You and/or your care partner have signed paperwork which will be entered into your electronic medical record.  These signatures attest to the fact that that the information above on your After Visit Summary has been reviewed and is understood.  Full responsibility of the confidentiality of this discharge information lies with you and/or your care-partner.

## 2019-09-01 NOTE — Progress Notes (Signed)
Pt's states no medical or surgical changes since previsit or office visit. 

## 2019-09-01 NOTE — Progress Notes (Signed)
Called to room to assist during endoscopic procedure.  Patient ID and intended procedure confirmed with present staff. Received instructions for my participation in the procedure from the performing physician.  

## 2019-09-01 NOTE — Progress Notes (Signed)
To PACU, VSS. Report to [email or web address] 

## 2019-09-03 ENCOUNTER — Telehealth: Payer: Self-pay

## 2019-09-03 NOTE — Telephone Encounter (Signed)
  Follow up Call-  Call back number 09/01/2019  Post procedure Call Back phone  # [phone]  Permission to leave phone message Yes  Some recent data might be hidden     Patient questions:  Do you have a fever, pain , or abdominal swelling? No. Pain Score  0 *  Have you tolerated food without any problems? Yes.    Have you been able to return to your normal activities? Yes.    Do you have any questions about your discharge instructions: Diet   No. Medications  No. Follow up visit  No.  Do you have questions or concerns about your Care? No.  Actions: * If pain score is 4 or above: No action needed, pain <4.  1. Have you developed a fever since your procedure? no  2.   Have you had an respiratory symptoms (SOB or cough) since your procedure? no  3.   Have you tested positive for COVID 19 since your procedure no  4.   Have you had any family members/close contacts diagnosed with the COVID 19 since your procedure?  no   If yes to any of these questions please route to Joylene John, RN and Alphonsa Gin, Therapist, sports.

## 2019-09-05 ENCOUNTER — Encounter: Payer: Self-pay | Admitting: Gastroenterology

## 2019-09-17 ENCOUNTER — Encounter: Payer: Self-pay | Admitting: Family Medicine

## 2020-06-13 ENCOUNTER — Emergency Department (HOSPITAL_COMMUNITY)
Admission: EM | Admit: 2020-06-13 | Discharge: 2020-06-13 | Disposition: A | Discharge status: Home / Self Care | Payer: Managed Care, Other (non HMO) | Attending: Emergency Medicine | Admitting: Emergency Medicine

## 2020-06-13 ENCOUNTER — Other Ambulatory Visit: Payer: Self-pay

## 2020-06-13 ENCOUNTER — Emergency Department (INDEPENDENT_AMBULATORY_CARE_PROVIDER_SITE_OTHER)
Admission: RE | Admit: 2020-06-13 | Discharge: 2020-06-13 | Disposition: A | Discharge status: Home / Self Care | Payer: Managed Care, Other (non HMO) | Source: Ambulatory Visit

## 2020-06-13 ENCOUNTER — Emergency Department (HOSPITAL_COMMUNITY): Payer: Managed Care, Other (non HMO)

## 2020-06-13 ENCOUNTER — Encounter (HOSPITAL_COMMUNITY): Payer: Self-pay | Admitting: Emergency Medicine

## 2020-06-13 VITALS — BP 162/90 | HR 67 | Temp 99.8°F | Resp 15 | Ht 73.0 in

## 2020-06-13 DIAGNOSIS — R519 Headache, unspecified: Secondary | ICD-10-CM | POA: Diagnosis not present

## 2020-06-13 DIAGNOSIS — R739 Hyperglycemia, unspecified: Secondary | ICD-10-CM | POA: Diagnosis not present

## 2020-06-13 DIAGNOSIS — R220 Localized swelling, mass and lump, head: Secondary | ICD-10-CM | POA: Diagnosis present

## 2020-06-13 DIAGNOSIS — J069 Acute upper respiratory infection, unspecified: Secondary | ICD-10-CM | POA: Diagnosis not present

## 2020-06-13 DIAGNOSIS — R202 Paresthesia of skin: Secondary | ICD-10-CM | POA: Diagnosis not present

## 2020-06-13 DIAGNOSIS — H00011 Hordeolum externum right upper eyelid: Secondary | ICD-10-CM

## 2020-06-13 DIAGNOSIS — F17210 Nicotine dependence, cigarettes, uncomplicated: Secondary | ICD-10-CM | POA: Diagnosis not present

## 2020-06-13 LAB — CBC
HCT: 44.6 % (ref 39.0–52.0)
Hemoglobin: 15.3 g/dL (ref 13.0–17.0)
MCH: 31.9 pg (ref 26.0–34.0)
MCHC: 34.3 g/dL (ref 30.0–36.0)
MCV: 92.9 fL (ref 80.0–100.0)
Platelets: 199 10*3/uL (ref 150–400)
RBC: 4.8 MIL/uL (ref 4.22–5.81)
RDW: 11.9 % (ref 11.5–15.5)
WBC: 8.2 10*3/uL (ref 4.0–10.5)
nRBC: 0 % (ref 0.0–0.2)

## 2020-06-13 LAB — COMPREHENSIVE METABOLIC PANEL
ALT: 32 U/L (ref 0–44)
AST: 23 U/L (ref 15–41)
Albumin: 4 g/dL (ref 3.5–5.0)
Alkaline Phosphatase: 52 U/L (ref 38–126)
Anion gap: 9 (ref 5–15)
BUN: 11 mg/dL (ref 6–20)
CO2: 26 mmol/L (ref 22–32)
Calcium: 9.3 mg/dL (ref 8.9–10.3)
Chloride: 101 mmol/L (ref 98–111)
Creatinine, Ser: 0.78 mg/dL (ref 0.61–1.24)
GFR, Estimated: >60 mL/min (ref >60)
Glucose, Bld: 256 mg/dL — ABNORMAL HIGH (ref 70–99)
Potassium: 3.9 mmol/L (ref 3.5–5.1)
Sodium: 136 mmol/L (ref 135–145)
Total Bilirubin: 0.6 mg/dL (ref 0.3–1.2)
Total Protein: 6.7 g/dL (ref 6.5–8.1)

## 2020-06-13 MED ORDER — SODIUM CHLORIDE 0.9% FLUSH
3.0000 mL | Freq: Once | INTRAVENOUS | Status: DC
Start: 2020-06-13 — End: 2020-06-14

## 2020-06-13 MED ORDER — METFORMIN HCL 500 MG PO TABS: 500.0000 mg | ORAL_TABLET | Freq: Two times a day (BID) | ORAL | 0 refills | Status: DC

## 2020-06-13 NOTE — ED Triage Notes (Addendum)
Sore throat since Friday morning after drinking a lot of wine on Thursday  Sinus pressure on & off x 1 year Pt had a massage on Friday morning 'Doesn't feel like himself" per pt - c/o feeling dizzy C/o feeling numb to right side of neck down to left hand - can't feel temperature change in left arm R eyelid red & swollen Denies vision changes- no contacts Pfizer vaccine in April - due for a booster

## 2020-06-13 NOTE — ED Triage Notes (Signed)
Multiple symptoms.  Reports R eyelid swelling since Wednesday.  Drank heavy ETOH on Thursday.  Still felt intoxicated on Friday and got a 90 min massage. Also reports sore throat on Friday.  On Saturday felt hung over and had pressure to R side of head and neck that he states is similar to before and relieved with Flonase previously.  Today pt having soreness to R side of throat.  He states he can't feel hot/cold to bilateral hands, L side of abd, and L side of neck.  States he noticed the "numbness" feeling while taking a shower and couldn't tell the difference in hot and cold water.  No arm drift.  Speech clear.

## 2020-06-13 NOTE — ED Provider Notes (Signed)
[facility]    CSN: [id number] Arrival date & time: 06/13/20  1508      History   Chief Complaint Chief Complaint  Patient presents with  . Appointment  . Facial Pain    HPI Jack Hall. is a 49 y.o. male.   HPI  Jack Hall. is a 49 y.o. male presenting to UC with c/o Right side headache and facial pain along with Left arm tingling and loss of sensation of temperature changes since yesterday.  He reports drinking a lot of wine during Thanksgiving 3 days ago, he had a 90 minute deep tissue massage 2 days ago and has continued to feel mild dizziness. States he "doesn't feel like himself."  He also reports redness and swelling of Right upper eyelid.  Denies fever, chills, cough, congestion, no chest pain, palpitations or SOB.  No vision changes. Denies weakness in his arms or legs. Pt drove himself to [facility].   Past Medical History:  Diagnosis Date  . Allergic rhinitis    otc flonase  . Allergy    seasonal  . GERD (gastroesophageal reflux disease)    prilosec- hiatal hernia  . Hiatal hernia   . Hyperlipidemia   . Inguinal hernia    left  . Obesity     Patient Active Problem List   Diagnosis Date Noted  . Epididymal cyst 05/09/2017  . Hyperlipidemia 11/10/2015  . Left groin hernia 11/10/2015  . Smoker 05/12/2015  . Family history of premature CAD 05/12/2015  . Allergic rhinitis   . Obesity   . GERD (gastroesophageal reflux disease)     Past Surgical History:  Procedure Laterality Date  . WISDOM TOOTH EXTRACTION     general anesthesia       Home Medications    Prior to Admission medications   Medication Sig Start Date End Date Taking? Authorizing Provider  atorvastatin (LIPITOR) 40 MG tablet Take 1 tablet (40 mg total) by mouth daily. 07/01/19   Marin Olp, MD  fluticasone (FLONASE) 50 MCG/ACT nasal spray Place into both nostrils as needed.     [provider]  ibuprofen (ADVIL) 200 MG tablet Take 400 mg by  mouth every 6 (six) hours as needed for headache or mild pain.    [provider]  Omeprazole Magnesium (PRILOSEC OTC PO) Take 20 mg by mouth daily.     [provider]    Family History Family History  Problem Relation Age of Onset  . Arthritis Mother   . Diabetes Mother        grandmother  . CAD Father   . Sudden death Father        heart attack 82- constant stress, poor food choices, smoked heavier than patient  . Colon polyps Sister        11 sessile serrated polyps.   . Colon cancer Paternal Grandfather   . Esophageal cancer Neg Hx   . Rectal cancer Neg Hx   . Stomach cancer Neg Hx     Social History Social History   Tobacco Use  . Smoking status: Current Every Day Smoker    Packs/day: 0.50    Types: Cigarettes  . Smokeless tobacco: Never Used  Vaping Use  . Vaping Use: Never used  Substance Use Topics  . Alcohol use: Yes    Alcohol/week: 5.0 - 8.0 standard drinks    Types: 2 - 5 Standard drinks or equivalent, 2 Glasses of wine, 1 Shots of liquor per  week  . Drug use: Not Currently    Types: Marijuana    Comment: rare marijuana     Allergies   Patient has no known allergies.   Review of Systems Review of Systems  Constitutional: Negative for chills and fever.  Respiratory: Negative for chest tightness and shortness of breath.   Cardiovascular: Negative for chest pain and palpitations.  Gastrointestinal: Negative for diarrhea, nausea and vomiting.  Musculoskeletal: Negative for neck pain and neck stiffness.  Skin: Positive for color change. Negative for wound.  Neurological: Positive for dizziness, numbness and headaches. Negative for syncope, facial asymmetry, speech difficulty, weakness and light-headedness.     Physical Exam Triage Vital Signs ED Triage Vitals  Enc Vitals Group     BP 06/13/20 1530 (!) 162/90     Pulse Rate 06/13/20 1530 67     Resp 06/13/20 1530 15     Temp 06/13/20 1530 99.8 F (37.7 C)     Temp Source  06/13/20 1530 Oral     SpO2 06/13/20 1530 96 %     Weight --      Height 06/13/20 1540 6\' 1"  (1.854 m)     Head Circumference --      Peak Flow --      Pain Score 06/13/20 1537 5     Pain Loc --      Pain Edu? --      Excl. in [place]? --    No data found.  Updated Vital Signs BP (!) 162/90 (BP Location: Right Arm)   Pulse 67   Temp 99.8 F (37.7 C) (Oral)   Resp 15   Ht 6\' 1"  (1.854 m)   SpO2 96%   BMI 34.96 kg/m   Visual Acuity Right Eye Distance:   Left Eye Distance:   Bilateral Distance:    Right Eye Near:   Left Eye Near:    Bilateral Near:     Physical Exam Vitals and nursing note reviewed.  Constitutional:      General: He is not in acute distress.    Appearance: Normal appearance. He is well-developed. He is not ill-appearing, toxic-appearing or diaphoretic.  HENT:     Head: Normocephalic and atraumatic.     Right Ear: Tympanic membrane and ear canal normal.     Left Ear: Tympanic membrane and ear canal normal.     Nose: Nose normal.     Right Sinus: No maxillary sinus tenderness or frontal sinus tenderness.     Left Sinus: No maxillary sinus tenderness or frontal sinus tenderness.     Mouth/Throat:     Lips: Pink.     Mouth: Mucous membranes are moist.     Pharynx: Oropharynx is clear. Uvula midline. No pharyngeal swelling, oropharyngeal exudate, posterior oropharyngeal erythema or uvula swelling.  Eyes:     Extraocular Movements: Extraocular movements intact.     Conjunctiva/sclera: Conjunctivae normal.     Pupils: Pupils are equal, round, and reactive to light.  Cardiovascular:     Rate and Rhythm: Normal rate and regular rhythm.  Pulmonary:     Effort: Pulmonary effort is normal. No respiratory distress.     Breath sounds: Normal breath sounds. No stridor. No wheezing, rhonchi or rales.  Musculoskeletal:        General: Normal range of motion.     Cervical back: Normal range of motion and neck supple. No tenderness.  Lymphadenopathy:     Cervical:  No cervical adenopathy.  Skin:    General:  Skin is warm and dry.     Capillary Refill: Capillary refill takes less than 2 seconds.  Neurological:     General: No focal deficit present.     Mental Status: He is alert and oriented to person, place, and time.     GCS: GCS eye subscore is 4. GCS verbal subscore is 5. GCS motor subscore is 6.     Cranial Nerves: No cranial nerve deficit.     Sensory: No sensory deficit.     Motor: No weakness.     Coordination: Coordination is intact. Romberg sign negative. Coordination normal. Finger-Nose-Finger Test normal.     Gait: Gait is intact. Gait and tandem walk normal.  Psychiatric:        Mood and Affect: Mood normal.        Behavior: Behavior normal.      UC Treatments / Results  Labs (all labs ordered are listed, but only abnormal results are displayed) Labs Reviewed - No data to display  EKG   Radiology No results found.  Procedures Procedures (including critical care time)  Medications Ordered in UC Medications - No data to display  Initial Impression / Assessment and Plan / UC Course  I have reviewed the triage vital signs and the nursing notes.  Pertinent labs & imaging results that were available during my care of the patient were reviewed by me and considered in my medical decision making (see chart for details).     Normal neuro exam, however, due to reported Right side head pressure along with Left arm paresthesia, sensation of temperature change, recommend further evaluation in emergency department.  Pt declined EMS transport but is agreeable to have his sister drive him to [facility] discharged in stable condition, ambulated to car easily without assistance.   Final Clinical Impressions(s) / UC Diagnoses   Final diagnoses:  Right-sided headache  Arm paresthesia, left  Hordeolum externum of right upper eyelid     Discharge Instructions      Your symptoms are concerning for a mild stroke.  You  have declined EMS transport, please have your family member drive you directly to the emergency department for further evaluation and treatment.     ED Prescriptions    None     PDMP not reviewed this encounter.   Noe Gens, [place] 06/13/20 1625

## 2020-06-13 NOTE — ED Provider Notes (Signed)
[facility] EMERGENCY DEPARTMENT Provider Note  CSN: [id number] Arrival date & time: 06/13/20 1655    History Chief Complaint  Patient presents with  . Facial Swelling  . decreased sensation    HPI  Jack Hall. is a 49 y.o. male with multiple complaints but primarily here for LUE paresthesia from UC today. He has had general ill feeling since drinking heavily on Thanksgiving including sore throat, sinus pressure and hordeolum. Today he noticed tingling his L hand and a change in sensation up his L arm and into his L shoulder. He was seen at [facility] and sent to the ED for further evaluation. He states that while he was waiting for a room, his symptoms have improved significantly.    Past Medical History:  Diagnosis Date  . Allergic rhinitis    otc flonase  . Allergy    seasonal  . GERD (gastroesophageal reflux disease)    prilosec- hiatal hernia  . Hiatal hernia   . Hyperlipidemia   . Inguinal hernia    left  . Obesity     Past Surgical History:  Procedure Laterality Date  . WISDOM TOOTH EXTRACTION     general anesthesia    Family History  Problem Relation Age of Onset  . Arthritis Mother   . Diabetes Mother        grandmother  . CAD Father   . Sudden death Father        heart attack 47- constant stress, poor food choices, smoked heavier than patient  . Colon polyps Sister        11 sessile serrated polyps.   . Colon cancer Paternal Grandfather   . Esophageal cancer Neg Hx   . Rectal cancer Neg Hx   . Stomach cancer Neg Hx     Social History   Tobacco Use  . Smoking status: Current Every Day Smoker    Packs/day: 0.50    Types: Cigarettes  . Smokeless tobacco: Never Used  Vaping Use  . Vaping Use: Never used  Substance Use Topics  . Alcohol use: Yes    Alcohol/week: 5.0 - 8.0 standard drinks    Types: 2 - 5 Standard drinks or equivalent, 2 Glasses of wine, 1 Shots of liquor per week  . Drug use: Not Currently    Types: Marijuana     Comment: rare marijuana     Home Medications Prior to Admission medications   Medication Sig Start Date End Date Taking? Authorizing Provider  atorvastatin (LIPITOR) 40 MG tablet Take 1 tablet (40 mg total) by mouth daily. 07/01/19   Marin Olp, MD  fluticasone (FLONASE) 50 MCG/ACT nasal spray Place into both nostrils as needed.     [provider]  ibuprofen (ADVIL) 200 MG tablet Take 400 mg by mouth every 6 (six) hours as needed for headache or mild pain.    [provider]  Omeprazole Magnesium (PRILOSEC OTC PO) Take 20 mg by mouth daily.     [provider]     Allergies    Patient has no known allergies.   Review of Systems   Review of Systems A comprehensive review of systems was completed and negative except as noted in HPI.    Physical Exam BP (!) 145/82 (BP Location: Right Arm)   Pulse 98   Temp 98.3 F (36.8 C) (Oral)   Resp 18   Ht 6\' 1"  (1.854 m)   Wt 120 kg   SpO2 98%  BMI 34.90 kg/m   Physical Exam Vitals and nursing note reviewed.  Constitutional:      Appearance: Normal appearance.  HENT:     Head: Normocephalic and atraumatic.     Nose: Nose normal.     Mouth/Throat:     Mouth: Mucous membranes are moist.  Eyes:     Extraocular Movements: Extraocular movements intact.     Conjunctiva/sclera: Conjunctivae normal.  Cardiovascular:     Rate and Rhythm: Normal rate.  Pulmonary:     Effort: Pulmonary effort is normal.     Breath sounds: Normal breath sounds.  Abdominal:     General: Abdomen is flat.     Palpations: Abdomen is soft.     Tenderness: There is no abdominal tenderness.  Musculoskeletal:        General: No swelling. Normal range of motion.     Cervical back: Neck supple.  Skin:    General: Skin is warm and dry.  Neurological:     General: No focal deficit present.     Mental Status: He is alert and oriented to person, place, and time.     Cranial Nerves: No cranial nerve deficit.     Sensory: No  sensory deficit.     Motor: No weakness.     Coordination: Coordination normal.  Psychiatric:        Mood and Affect: Mood normal.      ED Results / Procedures / Treatments   Labs (all labs ordered are listed, but only abnormal results are displayed) Labs Reviewed  COMPREHENSIVE METABOLIC PANEL - Abnormal; Notable for the following components:      Result Value   Glucose, Bld 256 (*)    All other components within normal limits  CBC    EKG EKG Interpretation  Date/Time:  Sunday June 13 2020 17:09:35 EST Ventricular Rate:  62 PR Interval:  172 QRS Duration: 114 QT Interval:  402 QTC Calculation: 408 R Axis:   71 Text Interpretation: Normal sinus rhythm Normal ECG No old tracing to compare Confirmed by Calvert Cantor [phone]) on 06/13/2020 9:41:38 PM   Radiology CT HEAD WO CONTRAST  Result Date: 06/13/2020 CLINICAL DATA:  Dizziness. EXAM: CT HEAD WITHOUT CONTRAST TECHNIQUE: Contiguous axial images were obtained from the base of the skull through the vertex without intravenous contrast. COMPARISON:  None. FINDINGS: Brain: No evidence of acute infarction, hemorrhage, hydrocephalus, extra-axial collection or mass lesion/mass effect. Vascular: No hyperdense vessel or unexpected calcification. Skull: Normal. Negative for fracture or focal lesion. Sinuses/Orbits: No acute finding. Other: None. IMPRESSION: No acute intracranial pathology. Electronically Signed   By: Virgina Norfolk M.D.   On: 06/13/2020 18:49    Procedures Procedures  Medications Ordered in the ED Medications  sodium chloride flush (NS) 0.9 % injection 3 mL (has no administration in time range)     MDM Rules/Calculators/A&P MDM Patient's labs and imaging ordered in triage are normal except for elevated glucose. He has no known history of DM. Denies any polyuria or polydipsia recently. Has multiple family members with DM. His arm symptoms are most consistent with a peripheral neuropathy or paresthesia.  No concern for a stroke. Will plan d/c with Rx for Metformin. Advised to drink plenty of water, avoid EtOH. Instructions for low carb diet. PCP follow up for further evaluation of his hyperglycemia and paresthesia.   ED Course  I have reviewed the triage vital signs and the nursing notes.  Pertinent labs & imaging results that were available during my care  of the patient were reviewed by me and considered in my medical decision making (see chart for details).     Final Clinical Impression(s) / ED Diagnoses Final diagnoses:  Paresthesia  Hyperglycemia  Upper respiratory tract infection, unspecified type    Rx / DC Orders ED Discharge Orders    None       Truddie Hidden, MD 06/13/20 2210

## 2020-06-13 NOTE — Discharge Instructions (Signed)
  Your symptoms are concerning for a mild stroke.  You have declined EMS transport, please have your family member drive you directly to the emergency department for further evaluation and treatment.

## 2020-06-13 NOTE — ED Notes (Signed)
Patient verbalizes understanding of discharge instructions. Prescriptions reviewed. Opportunity for questioning and answers were provided. Armband removed by staff, pt discharged from ED ambulatory.

## 2020-06-13 NOTE — ED Notes (Signed)
Patient is being discharged from the Urgent Care and sent to the Emergency Department via POV.-refused EMS Per Leeroy Cha, patient is in need of higher level of care due to need for higher level of care & labs. Patient is aware and verbalizes understanding of plan of care.  Vitals:   06/13/20 1530  BP: (!) 162/90  Pulse: 67  Resp: 15  Temp: 99.8 F (37.7 C)  SpO2: 96%  attempted to call report to triage/charge RN - no answer. Pt knows to give Urgent Care discharge  paperwork to ER desk on arrival at [facility] ER.

## 2020-07-02 ENCOUNTER — Other Ambulatory Visit: Payer: Self-pay | Admitting: Family Medicine

## 2020-07-05 NOTE — Patient Instructions (Addendum)
Please stop by lab before you go If you have mychart- we will send your results within 3 business days of [place] receiving them.  If you do not have mychart- we will call you about results within 5 business days of [place] receiving them.  *please note we are currently using [facility] which has a longer processing time than [place] [facility] typically so labs may not come back as quickly as in the past *please also note that you will see labs on mychart as soon as they post. I will later go in and write notes on them- will say "notes from Dr. Yong Channel"  Would suggest 1045mcg of b12 over the counter daily with having metformin and omeprazole on board  Let us know if numbness issues worsen or fail to improve

## 2020-07-05 NOTE — Progress Notes (Signed)
Phone: [phone]    Subjective:  Patient presents today for their annual physical. Chief complaint-noted.   See problem oriented charting- ROS- full  review of systems was completed and negative  except for: numbness into left hand. Also same weekend felt as numbness issues- noted some trouble swallowing and voice change- that has improved. Also felt dizzy/lightheaded at that time- thought he was dehydrated- that seems to be improving. Sinuses are clearing  The following were reviewed and entered/updated in epic: Past Medical History:  Diagnosis Date  . Allergic rhinitis    otc flonase  . Allergy    seasonal  . GERD (gastroesophageal reflux disease)    prilosec- hiatal hernia  . Hiatal hernia   . Hyperlipidemia   . Inguinal hernia    left  . Obesity    Patient Active Problem List   Diagnosis Date Noted  . Smoker 05/12/2015    Priority: High  . Family history of premature CAD 05/12/2015    Priority: High  . Hyperlipidemia 11/10/2015    Priority: Medium  . Left groin hernia 11/10/2015    Priority: Medium  . Obesity     Priority: Medium  . Epididymal cyst 05/09/2017    Priority: Low  . Allergic rhinitis     Priority: Low  . GERD (gastroesophageal reflux disease)     Priority: Low   Past Surgical History:  Procedure Laterality Date  . WISDOM TOOTH EXTRACTION     general anesthesia    Family History  Problem Relation Age of Onset  . Arthritis Mother   . Diabetes Mother        grandmother  . CAD Father   . Sudden death Father        heart attack 34- constant stress, poor food choices, smoked heavier than patient  . Colon polyps Sister        11 sessile serrated polyps.   . Colon cancer Paternal Grandfather   . Esophageal cancer Neg Hx   . Rectal cancer Neg Hx   . Stomach cancer Neg Hx     Medications- reviewed and updated Current Outpatient Medications  Medication Sig Dispense Refill  . atorvastatin (LIPITOR) 40 MG tablet TAKE 1 TABLET BY MOUTH  EVERY DAY 90 tablet 3  . fluticasone (FLONASE) 50 MCG/ACT nasal spray Place into both nostrils as needed.     Marland Kitchen ibuprofen (ADVIL) 200 MG tablet Take 400 mg by mouth every 6 (six) hours as needed for headache or mild pain.    . metFORMIN (GLUCOPHAGE) 500 MG tablet Take 1 tablet (500 mg total) by mouth 2 (two) times daily with a meal. 60 tablet 0  . Omeprazole Magnesium (PRILOSEC OTC PO) Take 20 mg by mouth as needed.     No current facility-administered medications for this visit.    Allergies-reviewed and updated No Known Allergies  Social History   Social History Narrative   Family: Single- not dating lately. Living alone now. 2020- got new poodle just before Covid 19.       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 No college      Hobbies: time at gym usually 4-5 days a week, friends and family, vacation      Objective:  BP 128/70   Pulse 91   Temp 97.7 F (36.5 C) (Temporal)   Resp 18   Ht 6' (1.829 m)   Wt 257 lb  9.6 oz (116.8 kg)   SpO2 96%   BMI 34.94 kg/m  Gen: NAD, resting comfortably HEENT: Mucous membranes are moist. Oropharynx normal Neck: no thyromegaly CV: RRR no murmurs rubs or gallops Lungs: CTAB no crackles, wheeze, rhonchi Abdomen: soft/nontender/nondistended/normal bowel sounds. No rebound or guarding.  Ext: no edema Skin: warm, dry Neuro: grossly normal, moves all extremities, PERRLA, and strength in bilateral upper extremities.  Normal sensation to monofilament.  With tuning fork he feels true sensation of cold in the right hand palm but feels different on the left hand.  On the back of his left hand it produces pain or its normal on the right hand on the back of the hand. Epididymal cyst just over 1 cm    Assessment and Plan:  49 y.o. male presenting for annual physical.  Health Maintenance counseling: 1. Anticipatory guidance: Patient counseled regarding regular  dental exams -q6 months, eye exams -denies issues-we discussed potentially updating exam as it has been over 15 years,  avoiding smoking and second hand smoke-unfortunately patient is a current smoker, limiting alcohol to 2 beverages per day-sparingly drinks alcohol perhaps once a month - encouraged limiting to 2 a day.   2. Risk factor reduction:  Advised patient of need for regular exercise and diet rich and fruits and vegetables to reduce risk of heart attack and stroke. Exercise-last year was walking his new dog daily but did not feel comfortable getting back to the gym - still walking dog- hard to find time with his job. Back at gym but not a habit yet. Diet-trying to change jobs as having to work 6 days a week and exhausted- has tried to cut down on sugary drinks and pastries- since late november. Trying to do mediterranean diet.  265 at last physical- down 8 lbs this year Wt Readings from Last 3 Encounters:  07/06/20 257 lb 9.6 oz (116.8 kg)  06/13/20 264 lb 8.8 oz (120 kg)  09/01/19 265 lb (120.2 kg)  3. Immunizations/screenings/ancillary studies-flu shot already had.  Discussed COVID-19 vaccination- already had.  Discussed hepatitis C screening-he opts in Immunization History  Administered Date(s) Administered  . Influenza Whole 05/03/2019  . Influenza-Unspecified 04/30/2017, 04/22/2018, 05/05/2020  . PFIZER SARS-COV-2 Vaccination 10/02/2019, 10/23/2019, 07/02/2020  . Tdap 11/10/2015  4. Prostate cancer screening- grandfather with prostate cancer in late 55s early 62s.  Rectal exam low risk last year.  We will trend PSA Lab Results  Component Value Date   PSA 0.22 06/30/2019   5. Colon cancer screening - 09/01/2019 with 1 year repeat due to 9 precancerous polyps 6. Skin cancer screening/prevention-follows with [facility] dermatology. advised regular sunscreen use. Denies worrisome, changing, or new skin lesions.  7. Testicular cancer screening- advised monthly self exams - he is not  checking-does have stable cyst which has previously been ultrasounded 8. STD screening- patient opts out- not sexually active 9. Current smoker-at 1/2 pack a day- feels like he is naturally headed toward cessation and declines intervention. family history of premature CAD in father age 67 and half strongly encourage cessation.  Will monitor UA with labs.  Will need AAA screen at 66.  Pack years- probably 3/4 pack a day on average for 20 years- at 15 pack years.     Status of chronic or acute concerns   #hyperglycemia- sugar over 200 in ER - possible diabetes- started on metformin . Did not get a1c in ER- check today. Started on metformin twice daily and has cleaned up diet. No true diabetes  diagnosis yet.   #Left arm paresthesias  S: with urgent evaluation 06/13/2020 -from urgent care note " Jack Hall. is a 49 y.o. male presenting to UC with c/o Right side headache and facial pain along with Left arm tingling and loss of sensation of temperature changes since yesterday.  He reports drinking a lot of wine during Thanksgiving 3 days ago, he had a 90 minute deep tissue massage 2 days ago and has continued to feel mild dizziness. States he "doesn't feel like himself."  He also reports redness and swelling of Right upper eyelid.  Denies fever, chills, cough, congestion, no chest pain, palpitations or SOB.  No vision changes. Denies weakness in his arms or legs. Pt drove himself to [facility]."Patient was sent to the emergency room with concern for stroke. He thought perhaps he was just hung over and then had a massage the next day. Felt a loss of temperature from his neck down to lower hand. Had some sinus symptom trying to treat with flonase. Not worse with neck positioning.   He was seen in the emergency room later that day with improved symptoms.  Reassuring head CT at that time.  From ED provider note " Patient's labs and imaging ordered in triage are normal except for elevated glucose. He has no  known history of DM. Denies any polyuria or polydipsia recently. Has multiple family members with DM. His arm symptoms are most consistent with a peripheral neuropathy or paresthesia. No concern for a stroke. Will plan d/c with Rx for Metformin. Advised to drink plenty of water, avoid EtOH. Instructions for low carb diet. PCP follow up for further evaluation of his hyperglycemia and paresthesia. "  Today patient reports, states at first couldn't feel any temperature- now starting to feel some warmth in the hand and seems to be going up his arm. Seems very sensitive to cold and produces tingling. No pain into the arm and does seem to be improving. Nothing on right hand or either feet. Does go up under the arm  A/P: paresthesias of unclear cause. Will update labs. Improving so he prefers to monitor- if worsens or fails to improve can refer to neurology.   #hyperlipidemia-premature CAD history and father S: Medication: Atorvastatin 40Mg  daily (increased from last year)  Stress test 12/16/2015 with cardiology which was low risk. Lab Results  Component Value Date   CHOL 168 06/30/2019   HDL 32.40 (L) 06/30/2019   LDLCALC 123 (H) 06/30/2019   LDLDIRECT 125.0 01/26/2016   TRIG 67.0 06/30/2019   CHOLHDL 5 06/30/2019   A/P: update lipids with higher dose today- hoping trending down  # GERD S:Medication: Prilosec probably 4-5 days a week.  Discussed using Pepcid instead last year B12 levels related to PPI use: No results found for: VITAMINB12 A/P: PPI could lower B12 absorption-check B12 lab.  Discussed again using Pepcid - Would suggest 1030mcg of b12 over the counter daily with having metformin and omeprazole on board  #Hernia left groin-defer surgery for now-rarely bothersome and not worsening. Can bulge at times but comes back down   #Epididymal cyst-stable on exam   Recommended follow up: Return in about 6 months (around 01/04/2021) for follow up- or sooner if needed. mainly depends on  a1c  Lab/Order associations: fasting   ICD-10-CM   1. Preventative health care  Z00.00 Hemoglobin A1c    Lipid panel    Hepatitis C antibody    POCT Urinalysis Dipstick (Automated)    CBC With Differential/Platelet  COMPLETE METABOLIC PANEL WITH GFR    PSA    Vitamin B12    TSH  2. Gastroesophageal reflux disease without esophagitis  K21.9   3. Hyperlipidemia, unspecified hyperlipidemia type  E78.5 Lipid panel    POCT Urinalysis Dipstick (Automated)    CBC With Differential/Platelet    COMPLETE METABOLIC PANEL WITH GFR  4. Smoker  F17.200 POCT Urinalysis Dipstick (Automated)  5. Need for hepatitis C screening test  Z11.59 Hepatitis C antibody  6. Hyperglycemia  R73.9 Hemoglobin A1c  7. Screening for prostate cancer  Z12.5 PSA  8. Paresthesias  R20.2 Vitamin B12    TSH    No orders of the defined types were placed in this encounter.   Return precautions advised.   Garret Reddish, MD

## 2020-07-06 ENCOUNTER — Ambulatory Visit (INDEPENDENT_AMBULATORY_CARE_PROVIDER_SITE_OTHER): Payer: Managed Care, Other (non HMO) | Admitting: Family Medicine

## 2020-07-06 ENCOUNTER — Other Ambulatory Visit: Payer: Self-pay | Admitting: Family Medicine

## 2020-07-06 ENCOUNTER — Other Ambulatory Visit: Payer: Self-pay

## 2020-07-06 ENCOUNTER — Encounter: Payer: Self-pay | Admitting: Family Medicine

## 2020-07-06 VITALS — BP 128/70 | HR 91 | Temp 97.7°F | Resp 18 | Ht 72.0 in | Wt 257.6 lb

## 2020-07-06 DIAGNOSIS — Z0000 Encounter for general adult medical examination without abnormal findings: Secondary | ICD-10-CM | POA: Diagnosis not present

## 2020-07-06 DIAGNOSIS — K219 Gastro-esophageal reflux disease without esophagitis: Secondary | ICD-10-CM | POA: Diagnosis not present

## 2020-07-06 DIAGNOSIS — Z125 Encounter for screening for malignant neoplasm of prostate: Secondary | ICD-10-CM

## 2020-07-06 DIAGNOSIS — F17200 Nicotine dependence, unspecified, uncomplicated: Secondary | ICD-10-CM

## 2020-07-06 DIAGNOSIS — R809 Proteinuria, unspecified: Secondary | ICD-10-CM

## 2020-07-06 DIAGNOSIS — E785 Hyperlipidemia, unspecified: Secondary | ICD-10-CM

## 2020-07-06 DIAGNOSIS — R202 Paresthesia of skin: Secondary | ICD-10-CM

## 2020-07-06 DIAGNOSIS — Z1159 Encounter for screening for other viral diseases: Secondary | ICD-10-CM

## 2020-07-06 DIAGNOSIS — R739 Hyperglycemia, unspecified: Secondary | ICD-10-CM

## 2020-07-06 LAB — POC URINALSYSI DIPSTICK (AUTOMATED)
Bilirubin, UA: NEGATIVE
Blood, UA: NEGATIVE
Glucose, UA: POSITIVE — AB
Ketones, UA: NEGATIVE
Nitrite, UA: NEGATIVE
Protein, UA: POSITIVE — AB
Spec Grav, UA: 1.015 (ref 1.010–1.025)
Urobilinogen, UA: 0.2 E.U./dL
pH, UA: 6 (ref 5.0–8.0)

## 2020-07-06 NOTE — Addendum Note (Signed)
Addended by: Thomes Cake on: 07/06/2020 09:07 AM   Modules accepted: Orders

## 2020-07-06 NOTE — Addendum Note (Signed)
Addended by: Thomes Cake on: 07/06/2020 09:09 AM   Modules accepted: Orders

## 2020-07-07 ENCOUNTER — Encounter: Payer: Self-pay | Admitting: Family Medicine

## 2020-07-07 DIAGNOSIS — E1165 Type 2 diabetes mellitus with hyperglycemia: Secondary | ICD-10-CM

## 2020-07-07 HISTORY — DX: Type 2 diabetes mellitus with hyperglycemia: E11.65

## 2020-07-07 LAB — COMPLETE METABOLIC PANEL WITH GFR
AG Ratio: 2.2 (calc) (ref 1.0–2.5)
ALT: 32 U/L (ref 9–46)
AST: 18 U/L (ref 10–40)
Albumin: 4.9 g/dL (ref 3.6–5.1)
Alkaline phosphatase (APISO): 109 U/L (ref 36–130)
BUN: 16 mg/dL (ref 7–25)
CO2: 30 mmol/L (ref 20–32)
Calcium: 10.1 mg/dL (ref 8.6–10.3)
Chloride: 102 mmol/L (ref 98–110)
Creat: 0.81 mg/dL (ref 0.60–1.35)
GFR, Est African American: 122 mL/min/{1.73_m2} (ref >=60)
GFR, Est Non African American: 105 mL/min/{1.73_m2} (ref >=60)
Globulin: 2.2 g/dL (calc) (ref 1.9–3.7)
Glucose, Bld: 212 mg/dL — ABNORMAL HIGH (ref 65–99)
Potassium: 4.4 mmol/L (ref 3.5–5.3)
Sodium: 139 mmol/L (ref 135–146)
Total Bilirubin: 0.6 mg/dL (ref 0.2–1.2)
Total Protein: 7.1 g/dL (ref 6.1–8.1)

## 2020-07-07 LAB — CBC WITH DIFFERENTIAL/PLATELET
Absolute Monocytes: 498 cells/uL (ref 200–950)
Basophils Absolute: 32 cells/uL (ref 0–200)
Basophils Relative: 0.4 %
Eosinophils Absolute: 111 cells/uL (ref 15–500)
Eosinophils Relative: 1.4 %
HCT: 50.6 % — ABNORMAL HIGH (ref 38.5–50.0)
Hemoglobin: 17.2 g/dL — ABNORMAL HIGH (ref 13.2–17.1)
Lymphs Abs: 2275 cells/uL (ref 850–3900)
MCH: 31.6 pg (ref 27.0–33.0)
MCHC: 34 g/dL (ref 32.0–36.0)
MCV: 93 fL (ref 80.0–100.0)
MPV: 11.1 fL (ref 7.5–12.5)
Monocytes Relative: 6.3 %
Neutro Abs: 4985 cells/uL (ref 1500–7800)
Neutrophils Relative %: 63.1 %
Platelets: 238 10*3/uL (ref 140–400)
RBC: 5.44 10*6/uL (ref 4.20–5.80)
RDW: 12.6 % (ref 11.0–15.0)
Total Lymphocyte: 28.8 %
WBC: 7.9 10*3/uL (ref 3.8–10.8)

## 2020-07-07 LAB — HEPATITIS C ANTIBODY
Hepatitis C Ab: NONREACTIVE
SIGNAL TO CUT-OFF: 0.02 (ref <1.00)

## 2020-07-07 LAB — LIPID PANEL
Cholesterol: 142 mg/dL (ref <200)
HDL: 31 mg/dL — ABNORMAL LOW (ref >=40)
LDL Cholesterol (Calc): 76 mg/dL (calc)
Non-HDL Cholesterol (Calc): 111 mg/dL (calc) (ref <130)
Total CHOL/HDL Ratio: 4.6 (calc) (ref <5.0)
Triglycerides: 268 mg/dL — ABNORMAL HIGH (ref <150)

## 2020-07-07 LAB — URINE CULTURE
MICRO NUMBER:: 11343123
Result:: NO GROWTH
SPECIMEN QUALITY:: ADEQUATE

## 2020-07-07 LAB — PSA: PSA: 0.29 ng/mL (ref <=4.0)

## 2020-07-07 LAB — VITAMIN B12: Vitamin B-12: 464 pg/mL (ref 200–1100)

## 2020-07-07 LAB — HEMOGLOBIN A1C
Hgb A1c MFr Bld: 8.4 % of total Hgb — ABNORMAL HIGH (ref <5.7)
Mean Plasma Glucose: 194 mg/dL
eAG (mmol/L): 10.8 mmol/L

## 2020-07-07 LAB — TSH: TSH: 1.38 mIU/L (ref 0.40–4.50)

## 2020-07-08 ENCOUNTER — Telehealth: Payer: Self-pay

## 2020-07-08 MED ORDER — METFORMIN HCL 1000 MG PO TABS: 1000.0000 mg | ORAL_TABLET | Freq: Two times a day (BID) | ORAL | 3 refills | Status: DC

## 2020-07-08 NOTE — Telephone Encounter (Signed)
Spoke with the patient and he verbalized understanding his results. He stated that he will watch the youtube videos that Dr. Yong Channel recommended to him. He has agreed to take Metformin 1000 mg 2 times daily. Medication has been sent to the pharmacy. No other questions or concerns at this time.

## 2021-01-06 ENCOUNTER — Ambulatory Visit (INDEPENDENT_AMBULATORY_CARE_PROVIDER_SITE_OTHER): Payer: Managed Care, Other (non HMO) | Admitting: Family Medicine

## 2021-01-06 ENCOUNTER — Other Ambulatory Visit: Payer: Self-pay

## 2021-01-06 ENCOUNTER — Encounter: Payer: Self-pay | Admitting: Family Medicine

## 2021-01-06 VITALS — BP 130/86 | HR 96 | Temp 97.6°F | Ht 72.0 in | Wt 258.4 lb

## 2021-01-06 DIAGNOSIS — Z1211 Encounter for screening for malignant neoplasm of colon: Secondary | ICD-10-CM | POA: Diagnosis not present

## 2021-01-06 DIAGNOSIS — K4090 Unilateral inguinal hernia, without obstruction or gangrene, not specified as recurrent: Secondary | ICD-10-CM

## 2021-01-06 DIAGNOSIS — R202 Paresthesia of skin: Secondary | ICD-10-CM

## 2021-01-06 DIAGNOSIS — E1165 Type 2 diabetes mellitus with hyperglycemia: Secondary | ICD-10-CM | POA: Diagnosis not present

## 2021-01-06 DIAGNOSIS — E785 Hyperlipidemia, unspecified: Secondary | ICD-10-CM

## 2021-01-06 DIAGNOSIS — E1169 Type 2 diabetes mellitus with other specified complication: Secondary | ICD-10-CM

## 2021-01-06 DIAGNOSIS — F17200 Nicotine dependence, unspecified, uncomplicated: Secondary | ICD-10-CM

## 2021-01-06 HISTORY — DX: Hyperlipidemia, unspecified: E78.5

## 2021-01-06 HISTORY — DX: Type 2 diabetes mellitus with other specified complication: E11.69

## 2021-01-06 LAB — COMPREHENSIVE METABOLIC PANEL
ALT: 36 U/L (ref 0–53)
AST: 22 U/L (ref 0–37)
Albumin: 4.6 g/dL (ref 3.5–5.2)
Alkaline Phosphatase: 70 U/L (ref 39–117)
BUN: 13 mg/dL (ref 6–23)
CO2: 27 mEq/L (ref 19–32)
Calcium: 9.8 mg/dL (ref 8.4–10.5)
Chloride: 100 mEq/L (ref 96–112)
Creatinine, Ser: 0.79 mg/dL (ref 0.40–1.50)
GFR: 104.7 mL/min (ref >60.00)
Glucose, Bld: 236 mg/dL — ABNORMAL HIGH (ref 70–99)
Potassium: 4 mEq/L (ref 3.5–5.1)
Sodium: 136 mEq/L (ref 135–145)
Total Bilirubin: 0.6 mg/dL (ref 0.2–1.2)
Total Protein: 7.4 g/dL (ref 6.0–8.3)

## 2021-01-06 LAB — CBC WITH DIFFERENTIAL/PLATELET
Basophils Absolute: 0 10*3/uL (ref 0.0–0.1)
Basophils Relative: 0.4 % (ref 0.0–3.0)
Eosinophils Absolute: 0.2 10*3/uL (ref 0.0–0.7)
Eosinophils Relative: 2.5 % (ref 0.0–5.0)
HCT: 48.8 % (ref 39.0–52.0)
Hemoglobin: 17.2 g/dL — ABNORMAL HIGH (ref 13.0–17.0)
Lymphocytes Relative: 30.5 % (ref 12.0–46.0)
Lymphs Abs: 2.4 10*3/uL (ref 0.7–4.0)
MCHC: 35.3 g/dL (ref 30.0–36.0)
MCV: 91.8 fl (ref 78.0–100.0)
Monocytes Absolute: 0.6 10*3/uL (ref 0.1–1.0)
Monocytes Relative: 7 % (ref 3.0–12.0)
Neutro Abs: 4.7 10*3/uL (ref 1.4–7.7)
Neutrophils Relative %: 59.6 % (ref 43.0–77.0)
Platelets: 201 10*3/uL (ref 150.0–400.0)
RBC: 5.31 Mil/uL (ref 4.22–5.81)
RDW: 12.8 % (ref 11.5–15.5)
WBC: 7.9 10*3/uL (ref 4.0–10.5)

## 2021-01-06 LAB — MICROALBUMIN / CREATININE URINE RATIO
Creatinine,U: 89.3 mg/dL
Microalb Creat Ratio: 9 mg/g (ref 0.0–30.0)
Microalb, Ur: 8 mg/dL — ABNORMAL HIGH (ref 0.0–1.9)

## 2021-01-06 LAB — HEMOGLOBIN A1C: Hgb A1c MFr Bld: 10.7 % — ABNORMAL HIGH (ref 4.6–6.5)

## 2021-01-06 NOTE — Progress Notes (Signed)
Phone [phone] In person visit   Subjective:   Jack Hall. is a 50 y.o. year old very pleasant male patient who presents for/with See problem oriented charting Chief Complaint  Patient presents with   Hyperlipidemia   Hyperglycemia   This visit occurred during the SARS-CoV-2 public health emergency.  Safety protocols were in place, including screening questions prior to the visit, additional usage of staff PPE, and extensive cleaning of exam room while observing appropriate contact time as indicated for disinfecting solutions.   Past Medical History-  Patient Active Problem List   Diagnosis Date Noted   Poorly controlled type 2 diabetes mellitus ([place]) 07/07/2020    Priority: High   Smoker 05/12/2015    Priority: High   Family history of premature CAD 05/12/2015    Priority: High   Hyperlipidemia associated with type 2 diabetes mellitus ([place]) 01/06/2021    Priority: Medium   Left groin hernia 11/10/2015    Priority: Medium   Obesity     Priority: Medium   Epididymal cyst 05/09/2017    Priority: Low   Allergic rhinitis     Priority: Low   GERD (gastroesophageal reflux disease)     Priority: Low    Medications- reviewed and updated Current Outpatient Medications  Medication Sig Dispense Refill   atorvastatin (LIPITOR) 40 MG tablet TAKE 1 TABLET BY MOUTH EVERY DAY 90 tablet 3   Cyanocobalamin (VITAMIN B-12 PO) Take by mouth.     fluticasone (FLONASE) 50 MCG/ACT nasal spray Place into both nostrils as needed.      ibuprofen (ADVIL) 200 MG tablet Take 400 mg by mouth every 6 (six) hours as needed for headache or mild pain.     metFORMIN (GLUCOPHAGE) 1000 MG tablet Take 1 tablet (1,000 mg total) by mouth 2 (two) times daily with a meal. 180 tablet 3   Omeprazole Magnesium (PRILOSEC OTC PO) Take 20 mg by mouth as needed.     No current facility-administered medications for this visit.     Objective:  BP 130/86   Pulse 96   Temp 97.6 F (36.4 C)  (Temporal)   Ht 6' (1.829 m)   Wt 258 lb 6.4 oz (117.2 kg)   SpO2 96%   BMI 35.05 kg/m  Gen: NAD, resting comfortably CV: RRR no murmurs rubs or gallops Lungs: CTAB no crackles, wheeze, rhonchi Abdomen: soft/nontender/nondistended/normal bowel sounds. No rebound or guarding.  Ext: no edema Skin: warm, dry Neuro: grossly normal, moves all extremities Diabetic Foot Exam - Simple   Simple Foot Form Diabetic Foot exam was performed with the following findings: Yes 01/06/2021  8:40 AM  Visual Inspection No deformities, no ulcerations, no other skin breakdown bilaterally: Yes Sensation Testing Intact to touch and monofilament testing bilaterally: Yes Pulse Check Posterior Tibialis and Dorsalis pulse intact bilaterally: Yes Comments       Assessment and Plan   # Diabetes S: Medication:Metformin 1000 mg twice daily- reasonably well- denies any side effects. He does smoke- half a pack daily. CBGs- none noted. Exercise and diet- not exercising as much as he would like to Lab Results  Component Value Date   HGBA1C 8.4 (H) 07/06/2020  A/P: hopefully stable/improved- update A1c today. Continue current medications. Patient requested a referral to diabetes education today which I think is a great idea. -Recommended diabetic eye exam    #hyperlipidemia- premature CAD history and father S: Medication:Atorvastatin 40 mg daily Lab Results  Component Value Date   CHOL 142 07/06/2020  HDL 31 (L) 07/06/2020   LDLCALC 76 07/06/2020   LDLDIRECT 125.0 01/26/2016   TRIG 268 (H) 07/06/2020   CHOLHDL 4.6 07/06/2020   A/P: LDL close to ideal goal of 70 or less at last visit at 76-continue current medication  # GERD S:Medication: Prilosec 20 mg as needed- he reports he is doing fine. A/P: We discussed potentially using Pepcid-patient will consider -Since patient has both metformin and PPI on board-recommended 1000 mcg of B12 daily last visit-need to check in on this next visit  #Left groin  hernia-patient has wanted to monitor- denies any issues at the current moment.   #Paresthesias of unclear etiology and left arm at last visit-was improving at that time-  Currently patient reports-some days it will tingle and others it will not.  As some temperature dysregulation.  Denies any neck or shoulder tenderness.  Perhaps slight improvement from last visit -With persistent issues we opted to do nerve conduction study on the left arm.  Also offered sports medicine or neurology referral but patient would like to start with nerve conduction study  #Polycythemia-likely caused by smoking-encourage smoking cessation once again today.  Patient currently smoking-half a pack daily-encouraged complete cessation.  Update CBC today  #Repeat colonoscopy-was recommended to have 1 year follow-up last February-we placed a new referral today  Recommended follow up: Return in about 6 months (around 07/08/2021) for physical or sooner if needed such as if A1c over 7.  Lab/Order associations:   ICD-10-CM   1. Poorly controlled type 2 diabetes mellitus (HCC)  E11.65 Urine Microalbumin w/creat. ratio    Comprehensive metabolic panel    CBC with Differential/Platelet    Hemoglobin A1c    Ambulatory referral to diabetic education    2. Screen for colon cancer  Z12.11 Ambulatory referral to Gastroenterology    3. Hyperlipidemia associated with type 2 diabetes mellitus ([place])  E11.69    E78.5     4. Paresthesias  R20.2 NCV with EMG(electromyography)    5. Smoker  F17.200     6. Left groin hernia  K40.90        I,Harris Phan,acting as a scribe for Garret Reddish, MD.,have documented all relevant documentation on the behalf of Garret Reddish, MD,as directed by  Garret Reddish, MD while in the presence of Garret Reddish, MD.   I, Garret Reddish, MD, have reviewed all documentation for this visit. The documentation on 01/06/21 for the exam, diagnosis, procedures, and orders are all accurate and complete.    Return precautions advised.  Garret Reddish, MD

## 2021-01-06 NOTE — Patient Instructions (Addendum)
Health Maintenance Due  Topic Date Due   OPHTHALMOLOGY EXAM   If you have had your eye exam within the last year, please sign release of information at check out desk. If you have not had an eye exam within a year, please get one at this time as this is important for your diabetes care  Never done   COLONOSCOPY  We will call you within two weeks about your referral to GI for repeat 1 year colonoscopy. If you do not hear within 2 weeks, give [place] a call.   08/31/2020   We will call you within two weeks about your referral to diabetes education and nerve conduction study. If you do not hear within 2 weeks, give [place] a call.    Please stop by lab before you go If you have mychart- we will send your results within 3 business days of [place] receiving them.  If you do not have mychart- we will call you about results within 5 business days of [place] receiving them.  *please also note that you will see labs on mychart as soon as they post. I will later go in and write notes on them- will say "notes from Dr. Yong Channel"   Recommended follow up: Return in about 6 months (around 07/08/2021) for physical or sooner if needed such as if A1c over 7.

## 2021-01-12 ENCOUNTER — Other Ambulatory Visit: Payer: Self-pay

## 2021-01-12 MED ORDER — ACCU-CHEK SOFTCLIX LANCETS MISC: 3 refills | Status: AC

## 2021-01-12 MED ORDER — ACCU-CHEK AVIVA PLUS VI STRP: ORAL_STRIP | 3 refills | Status: AC

## 2021-01-12 MED ORDER — ACCU-CHEK AVIVA PLUS W/DEVICE KIT: PACK | 3 refills | Status: AC

## 2021-01-12 MED ORDER — EMPAGLIFLOZIN 10 MG PO TABS: 10.0000 mg | ORAL_TABLET | Freq: Every day | ORAL | 3 refills | Status: DC

## 2021-03-02 ENCOUNTER — Other Ambulatory Visit: Payer: Self-pay

## 2021-03-02 ENCOUNTER — Encounter: Payer: Self-pay | Admitting: Registered"

## 2021-03-02 ENCOUNTER — Encounter: Payer: Managed Care, Other (non HMO) | Attending: Family Medicine | Admitting: Registered"

## 2021-03-02 DIAGNOSIS — E119 Type 2 diabetes mellitus without complications: Secondary | ICD-10-CM

## 2021-03-02 NOTE — Patient Instructions (Signed)
Goals: Follow Diabetes Meal Plan as instructed Eat 3 meals and 2 snacks, every 3-5 hrs Aim to have 1/2 plate of non-starchy vegetables + 1/4 plate of carbohydrates + 1/4 plate of lean protein Have lunch daily Add lean protein foods to meals/snacks Monitor glucose levels as instructed by your doctor Aim for 30 mins of physical activity 1-2 days/week Bring food record and glucose log to your next nutrition visit

## 2021-03-02 NOTE — Progress Notes (Signed)
Diabetes Self-Management Education  Visit Type:  First/Initial  Appt. Start Time: 9:45 Appt. End Time: 10:46  03/02/2021  [name], identified by name and date of birth, is a 50 y.o. male with a diagnosis of Diabetes: Type 2.   ASSESSMENT  Pt arrives with sister. Pt states he started cholesterol medication and then BS numbers started increasing. States at the point he started taking metformin. Reports A1c increased over time to 10.7% and then he became alarmed about diabetes. States when he started taking metformin he didn't change his eating habits. Reports mom, aunts, and cousins have diabetes. States he is used to seeing his mom prick her finger but has not used his meter yet. Reports fear of seeing BS numbers on glucometer. States during COVID his eating went crazy.   States he is no longer drinking sugary drinks, using splenda in coffee, avoiding carbohydrates at home (will eat carbohydrates when going out to eat). Reports he eats mostly fish and chicken. States he likes Mediterranean style food. States he skips lunch because it makes him tired.   States she has had low BS episodes with lightheadedness. States he no longer has hyperglycemic episodes since changing his diet after realizing his A1c was 10.7%. States at night he wants ice cream and has substituted with sugar-free pudding and fat-free milk.   States he works 5 days/week, 8 hours/day; 9 months out of the year but then the other 3 months will work  6-7 days, 10 hours/day.   Recent labs in system show  LDL panel: Chol 142 HDL 32 L Trig 268 H A1c: 10.7%   Pt expectations: Wants to control with diet and exercise.   There were no vitals taken for this visit. There is no height or weight on file to calculate BMI.    Diabetes Self-Management Education - 03/02/21 1000       Health Coping   How would you rate your overall health? Good      Psychosocial Assessment   Patient Belief/Attitude about Diabetes  Motivated to manage diabetes    Self-care barriers None    Self-management support Family    Special Needs None    Preferred Learning Style No preference indicated    Learning Readiness Not Ready      Complications   Last HgB A1C per patient/outside source 10.7 %    How often do you check your blood sugar? 0 times/day (not testing)    Number of hypoglycemic episodes per month 4    Can you tell when your blood sugar is low? Yes    What do you do if your blood sugar is low? eat cookie    Number of hyperglycemic episodes per week 0    Have you had a dilated eye exam in the past 12 months? No    Have you had a dental exam in the past 12 months? Yes    Are you checking your feet? Yes    How many days per week are you checking your feet? 7      Dietary Intake   Breakfast Egg white sandwich on whole grain muffin + cheese + unsweetened tea + coffee (with stevia)    Lunch skipped    Dinner salmon + salad (with vinegar, olive oil, lemon juice) + 3 wafer crackers + hummus    Snack (evening) 3 low-sugar cookies + milk    Beverage(s) skim milk, unsweetened tea, coffee, water (2-3L; 64-99 oz)      Exercise  Exercise Type Other (comment);Light (walking / raking leaves)   >10,000 steps a day   How many days per week to you exercise? 5    How many minutes per day do you exercise? 30    Total minutes per week of exercise 150      Patient Education   Previous Diabetes Education No    Disease state  Definition of diabetes, type 1 and 2, and the diagnosis of diabetes;Factors that contribute to the development of diabetes    Nutrition management  Role of diet in the treatment of diabetes and the relationship between the three main macronutrients and blood glucose level;Food label reading, portion sizes and measuring food.;Effects of alcohol on blood glucose and safety factors with consumption of alcohol.;Information on hints to eating out and maintain blood glucose control.    Physical activity and  exercise  Role of exercise on diabetes management, blood pressure control and cardiac health.    Monitoring Purpose and frequency of SMBG.;Taught/discussed recording of test results and interpretation of SMBG.;Interpreting lab values - A1C, lipid, urine microalbumina.;Identified appropriate SMBG and/or A1C goals.    Acute complications Taught treatment of hypoglycemia - the 15 rule.;Discussed and identified patients' treatment of hyperglycemia.    Chronic complications Applicable immunizations;Reviewed with patient heart disease, higher risk of, and prevention;Relationship between chronic complications and blood glucose control;Lipid levels, blood glucose control and heart disease    Psychosocial adjustment Role of stress on diabetes;Identified and addressed patients feelings and concerns about diabetes      Individualized Goals (developed by patient)   Nutrition Follow meal plan discussed    Physical Activity Exercise 1-2 times per week;30 minutes per day    Medications take my medication as prescribed    Monitoring  test my blood glucose as discussed    Reducing Risk examine blood glucose patterns;treat hypoglycemia with 15 grams of carbs if blood glucose less than '70mg'$ /dL      Post-Education Assessment   Patient understands the diabetes disease and treatment process. Demonstrates understanding / competency    Patient understands incorporating nutritional management into lifestyle. Demonstrates understanding / competency    Patient undertands incorporating physical activity into lifestyle. Demonstrates understanding / competency    Patient understands using medications safely. Needs Review    Patient understands monitoring blood glucose, interpreting and using results Demonstrates understanding / competency    Patient understands prevention, detection, and treatment of acute complications. Demonstrates understanding / competency    Patient understands prevention, detection, and treatment of  chronic complications. Needs Review    Patient understands how to develop strategies to address psychosocial issues. Demonstrates understanding / competency    Patient understands how to develop strategies to promote health/change behavior. Demonstrates understanding / competency      Outcomes   Program Status Not Completed             Learning Objective:  Patient will have a greater understanding of diabetes self-management. Patient education plan is to attend individual and/or group sessions per assessed needs and concerns.   Plan:   Patient Instructions  Goals: Follow Diabetes Meal Plan as instructed Eat 3 meals and 2 snacks, every 3-5 hrs Aim to have 1/2 plate of non-starchy vegetables + 1/4 plate of carbohydrates + 1/4 plate of lean protein Have lunch daily Add lean protein foods to meals/snacks Monitor glucose levels as instructed by your doctor Aim for 30 mins of physical activity 1-2 days/week Bring food record and glucose log to your next nutrition visit  Expected Outcomes:  Demonstrated interest in learning. Expect positive outcomes  Education material provided: ADA - How to Thrive: A Guide for Your Journey with Diabetes  If problems or questions, patient to contact team via:  Phone and Email  Future DSME appointment: - 4-6 wks

## 2021-03-09 ENCOUNTER — Ambulatory Visit (AMBULATORY_SURGERY_CENTER): Payer: Managed Care, Other (non HMO) | Admitting: *Deleted

## 2021-03-09 ENCOUNTER — Other Ambulatory Visit: Payer: Self-pay

## 2021-03-09 ENCOUNTER — Telehealth: Payer: Self-pay | Admitting: *Deleted

## 2021-03-09 VITALS — Ht 72.0 in | Wt 258.0 lb

## 2021-03-09 DIAGNOSIS — Z86010 Personal history of colonic polyps: Secondary | ICD-10-CM

## 2021-03-09 MED ORDER — NA SULFATE-K SULFATE-MG SULF 17.5-3.13-1.6 GM/177ML PO SOLN: 1.0000 | ORAL | 0 refills | Status: DC

## 2021-03-09 NOTE — Telephone Encounter (Signed)
Called patient x2 for his 8 am PV over the phone, no answer. Left a message for the patient to call me back today.

## 2021-03-09 NOTE — Telephone Encounter (Signed)
PV reschedule by the patient.

## 2021-03-09 NOTE — Progress Notes (Signed)
Patient's pre-visit was done today over the phone with the patient due to COVID-19 pandemic. Name,DOB and address verified. Insurance verified. Patient denies any allergies to Eggs and Soy. Patient denies any problems with anesthesia/sedation. Patient denies taking diet pills or blood thinners. No home Oxygen. Packet of Prep instructions mailed to patient including a copy of a consent form-pt is aware. Patient understands to call us back with any questions or concerns. Patient is aware of our care-partner policy and 0000000 safety protocol.   EMMI education assigned to the patient for the procedure, sent to [place].   The patient is COVID-19 vaccinated.

## 2021-03-23 ENCOUNTER — Encounter: Payer: Self-pay | Admitting: Gastroenterology

## 2021-03-23 ENCOUNTER — Other Ambulatory Visit: Payer: Self-pay

## 2021-03-23 ENCOUNTER — Ambulatory Visit (AMBULATORY_SURGERY_CENTER): Payer: Managed Care, Other (non HMO) | Admitting: Gastroenterology

## 2021-03-23 VITALS — BP 130/78 | HR 86 | Temp 99.1°F | Resp 14 | Ht 72.0 in | Wt 258.0 lb

## 2021-03-23 DIAGNOSIS — K635 Polyp of colon: Secondary | ICD-10-CM

## 2021-03-23 DIAGNOSIS — D123 Benign neoplasm of transverse colon: Secondary | ICD-10-CM | POA: Diagnosis not present

## 2021-03-23 DIAGNOSIS — Z86010 Personal history of colonic polyps: Secondary | ICD-10-CM

## 2021-03-23 DIAGNOSIS — Z8371 Family history of colonic polyps: Secondary | ICD-10-CM

## 2021-03-23 DIAGNOSIS — D122 Benign neoplasm of ascending colon: Secondary | ICD-10-CM | POA: Diagnosis not present

## 2021-03-23 DIAGNOSIS — D125 Benign neoplasm of sigmoid colon: Secondary | ICD-10-CM

## 2021-03-23 DIAGNOSIS — D124 Benign neoplasm of descending colon: Secondary | ICD-10-CM

## 2021-03-23 MED ORDER — SODIUM CHLORIDE 0.9 % IV SOLN: 500.0000 mL | Freq: Once | INTRAVENOUS | Status: DC

## 2021-03-23 NOTE — Patient Instructions (Signed)
The office will call you with a genetic counseling appointment.  Read all of the handouts given to you by your recovery room nurse.  YOU HAD AN ENDOSCOPIC PROCEDURE TODAY AT [facility]:   Refer to the procedure report that was given to you for any specific questions about what was found during the examination.  If the procedure report does not answer your questions, please call your gastroenterologist to clarify.  If you requested that your care partner not be given the details of your procedure findings, then the procedure report has been included in a sealed envelope for you to review at your convenience later.  YOU SHOULD EXPECT: Some feelings of bloating in the abdomen. Passage of more gas than usual.  Walking can help get rid of the air that was put into your GI tract during the procedure and reduce the bloating. If you had a lower endoscopy (such as a colonoscopy or flexible sigmoidoscopy) you may notice spotting of blood in your stool or on the toilet paper. If you underwent a bowel prep for your procedure, you may not have a normal bowel movement for a few days.  Please Note:  You might notice some irritation and congestion in your nose or some drainage.  This is from the oxygen used during your procedure.  There is no need for concern and it should clear up in a day or so.  SYMPTOMS TO REPORT IMMEDIATELY:  Following lower endoscopy (colonoscopy or flexible sigmoidoscopy):  Excessive amounts of blood in the stool  Significant tenderness or worsening of abdominal pains  Swelling of the abdomen that is new, acute  Fever of 100F or higher    For urgent or emergent issues, a gastroenterologist can be reached at any hour by calling [phone]. Do not use MyChart messaging for urgent concerns.    DIET:  We do recommend a small meal at first, but then you may proceed to your regular diet.  Drink plenty of fluids but you should avoid alcoholic beverages for 24  hours.  ACTIVITY:  You should plan to take it easy for the rest of today and you should NOT DRIVE or use heavy machinery until tomorrow (because of the sedation medicines used during the test).    FOLLOW UP: Our staff will call the number listed on your records 48-72 hours following your procedure to check on you and address any questions or concerns that you may have regarding the information given to you following your procedure. If we do not reach you, we will leave a message.  We will attempt to reach you two times.  During this call, we will ask if you have developed any symptoms of COVID 19. If you develop any symptoms (ie: fever, flu-like symptoms, shortness of breath, cough etc.) before then, please call [phone].  If you test positive for Covid 19 in the 2 weeks post procedure, please call and report this information to [place].    If any biopsies were taken you will be contacted by phone or by letter within the next 1-3 weeks.  Please call us at [phone] if you have not heard about the biopsies in 3 weeks.    SIGNATURES/CONFIDENTIALITY: You and/or your care partner have signed paperwork which will be entered into your electronic medical record.  These signatures attest to the fact that that the information above on your After Visit Summary has been reviewed and is understood.  Full responsibility of the confidentiality of this discharge  information lies with you and/or your care-partner.

## 2021-03-23 NOTE — Op Note (Signed)
[facility] Patient Name: Ries Yamanaka Procedure Date: 03/23/2021 8:54 AM MRN: [id number] Endoscopist: Ladene Artist , MD Age: 50 Referring MD:  Date of Birth: 1971-06-12 Gender: Male Account #: [id number] Procedure:                Colonoscopy Indications:              High risk colon cancer surveillance: Personal                            history of multiple sessile serrated colon polyp                            (one was 10 mm or greater in size), Personal                            history of adenomatous colon polyps (one was 10 mm                            or greater in size), Family history of colon                            polyps, sister. Medicines:                Monitored Anesthesia Care Procedure:                Pre-Anesthesia Assessment:                           - Prior to the procedure, a History and Physical                            was performed, and patient medications and                            allergies were reviewed. The patient's tolerance of                            previous anesthesia was also reviewed. The risks                            and benefits of the procedure and the sedation                            options and risks were discussed with the patient.                            All questions were answered, and informed consent                            was obtained. Prior Anticoagulants: The patient has                            taken no previous anticoagulant or antiplatelet  agents. ASA Grade Assessment: II - A patient with                            mild systemic disease. After reviewing the risks                            and benefits, the patient was deemed in                            satisfactory condition to undergo the procedure.                           After obtaining informed consent, the colonoscope                            was passed under direct vision. Throughout the                             procedure, the patient's blood pressure, pulse, and                            oxygen saturations were monitored continuously. The                            Colonoscope was introduced through the anus and                            advanced to the the cecum, identified by                            appendiceal orifice and ileocecal valve. The                            ileocecal valve, appendiceal orifice, and rectum                            were photographed. The quality of the bowel                            preparation was good. The colonoscopy was performed                            without difficulty. The patient tolerated the                            procedure well. Scope In: 9:06:37 AM Scope Out: 9:41:23 AM Scope Withdrawal Time: 0 hours 29 minutes 46 seconds  Total Procedure Duration: 0 hours 34 minutes 46 seconds  Findings:                 The perianal and digital rectal examinations were                            normal.  Fifteen sessile polyps were found in the sigmoid                            colon (4), descending colon (2), transverse colon                            (6) and ascending colon (3). The polyps were 5 to 8                            mm in size. These polyps were removed with a cold                            snare. Resection and retrieval were complete.                           Multiple small-mouthed diverticula were found in                            the left colon. There was no evidence of                            diverticular bleeding.                           Several 3-5 mm hyperplastic appearing polyps in the                            left colon not removed.                           Internal hemorrhoids were found during                            retroflexion. The hemorrhoids were small and Grade                            I (internal hemorrhoids that do not prolapse).                           The  exam was otherwise without abnormality on                            direct and retroflexion views. Complications:            No immediate complications. Estimated blood loss:                            None. Estimated Blood Loss:     Estimated blood loss: none. Impression:               - Fifteen 5 to 8 mm polyps in the sigmoid colon, in                            the descending colon, in the transverse colon and  in the ascending colon, removed with a cold snare.                            Resected and retrieved.                           - Several 3-5 mm hyperplastic appearing polyps in                            the left colon not removed.                           - Mild diverticulosis in the left colon.                           - Internal hemorrhoids.                           - The examination was otherwise normal on direct                            and retroflexion views. Recommendation:           - Repeat colonoscopy after studies are complete for                            surveillance based on pathology results.                           - Patient has a contact number available for                            emergencies. The signs and symptoms of potential                            delayed complications were discussed with the                            patient. Return to normal activities tomorrow.                            Written discharge instructions were provided to the                            patient.                           - High fiber diet.                           - Continue present medications.                           - Genetic counceling referral.                           - Await pathology results. Pricilla Riffle  Fuller Plan, MD 03/23/2021 9:50:32 AM This report has been signed electronically.

## 2021-03-23 NOTE — Progress Notes (Signed)
VS completed by SM.  Pt's states no medical or surgical changes since previsit or office visit.  

## 2021-03-23 NOTE — Progress Notes (Signed)
History & Physical  Primary Care Physician:  Marin Olp, MD Primary Gastroenterologist: Jerilynn Mages. Fuller Plan, MD  CHIEF COMPLAINT:  Personal history of colon polyps, family history of colon polyps  HPI: Jack Hall. is a 50 y.o. male with a history of multiple sessile serrated polyps and tubular adenomas on his last colonoscopy.  His sister has had multiple colon polyps.  He presents for surveillance colonoscopy.  No ongoing gastrointestinal complaints.   Past Medical History:  Diagnosis Date   Allergic rhinitis    otc flonase   Allergy    seasonal   Diabetes mellitus without complication (HCC)    GERD (gastroesophageal reflux disease)    prilosec- hiatal hernia   Hiatal hernia    Hyperlipidemia    Inguinal hernia    left   Obesity     Past Surgical History:  Procedure Laterality Date   COLONOSCOPY  09/01/2019   Fuller Plan   WISDOM TOOTH EXTRACTION     Hall anesthesia    Prior to Admission medications   Medication Sig Start Date End Date Taking? Authorizing Provider  Accu-Chek Softclix Lancets lancets Use to test blood sugars daily. Dx: E11.9 01/12/21  Yes Marin Olp, MD  atorvastatin (LIPITOR) 40 MG tablet TAKE 1 TABLET BY MOUTH EVERY DAY 07/02/20  Yes Marin Olp, MD  Blood Glucose Monitoring Suppl (ACCU-CHEK AVIVA PLUS) w/Device KIT Use to test blood sugars daily.Dx: E11.9 01/12/21  Yes Marin Olp, MD  Cyanocobalamin (VITAMIN B-12 PO) Take by mouth.   Yes [provider]  fluticasone (FLONASE) 50 MCG/ACT nasal spray Place into both nostrils as needed.    Yes [provider]  glucose blood (ACCU-CHEK AVIVA PLUS) test strip Use to test blood sugars daily. Dx: E11.9 01/12/21  Yes Marin Olp, MD  ibuprofen (ADVIL) 200 MG tablet Take 400 mg by mouth every 6 (six) hours as needed for headache or mild pain.   Yes [provider]  metFORMIN (GLUCOPHAGE) 1000 MG tablet Take 1 tablet (1,000 mg total) by mouth 2 (two)  times daily with a meal. 07/08/20  Yes Marin Olp, MD  Omeprazole Magnesium (PRILOSEC OTC PO) Take 20 mg by mouth as needed.   Yes [provider]    Current Outpatient Medications  Medication Sig Dispense Refill   Accu-Chek Softclix Lancets lancets Use to test blood sugars daily. Dx: E11.9 100 each 3   atorvastatin (LIPITOR) 40 MG tablet TAKE 1 TABLET BY MOUTH EVERY DAY 90 tablet 3   Blood Glucose Monitoring Suppl (ACCU-CHEK AVIVA PLUS) w/Device KIT Use to test blood sugars daily.Dx: E11.9 1 kit 3   Cyanocobalamin (VITAMIN B-12 PO) Take by mouth.     fluticasone (FLONASE) 50 MCG/ACT nasal spray Place into both nostrils as needed.      glucose blood (ACCU-CHEK AVIVA PLUS) test strip Use to test blood sugars daily. Dx: E11.9 100 each 3   ibuprofen (ADVIL) 200 MG tablet Take 400 mg by mouth every 6 (six) hours as needed for headache or mild pain.     metFORMIN (GLUCOPHAGE) 1000 MG tablet Take 1 tablet (1,000 mg total) by mouth 2 (two) times daily with a meal. 180 tablet 3   Omeprazole Magnesium (PRILOSEC OTC PO) Take 20 mg by mouth as needed.     Current Facility-Administered Medications  Medication Dose Route Frequency Provider Last Rate Last Admin   0.9 %  sodium chloride infusion  500 mL Intravenous Once Ladene Artist, MD  Allergies as of 03/23/2021   (No Known Allergies)    Family History  Problem Relation Age of Onset   Arthritis Mother    Diabetes Mother        grandmother   CAD Father    Sudden death Father        heart attack 37- constant stress, poor food choices, smoked heavier than patient   Colon polyps Sister        11 sessile serrated polyps.    Colon cancer Paternal Grandfather    Cancer Other    Esophageal cancer Neg Hx    Rectal cancer Neg Hx    Stomach cancer Neg Hx     Social History   Socioeconomic History   Marital status: Single    Spouse name: Not on file   Number of children: Not on file   Years of education: Not on file    Highest education level: Not on file  Occupational History   Not on file  Tobacco Use   Smoking status: Every Day    Packs/day: 0.50    Types: Cigarettes   Smokeless tobacco: Never  Vaping Use   Vaping Use: Never used  Substance and Sexual Activity   Alcohol use: Yes    Alcohol/week: 5.0 - 8.0 standard drinks    Types: 2 - 5 Standard drinks or equivalent, 2 Glasses of wine, 1 Shots of liquor per week   Drug use: Not Currently    Types: Marijuana    Comment: rare marijuana   Sexual activity: Not on file  Other Topics Concern   Not on file  Social History Narrative   Family: Single- not dating lately. Living alone now. 2020- got new poodle just before Covid 19.       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 No college      Hobbies: time at gym usually 4-5 days a week, friends and family, vacation   Social Determinants of Health   Financial Resource Strain: Not on file  Food Insecurity: Not on file  Transportation Needs: Not on file  Physical Activity: Not on file  Stress: Not on file  Social Connections: Not on file  Intimate Partner Violence: Not on file    Review of Systems:  All systems reviewed an negative except where noted in HPI.  Gen: Denies any fever, chills, sweats, anorexia, fatigue, weakness, malaise, weight loss, and sleep disorder CV: Denies chest pain, angina, palpitations, syncope, orthopnea, PND, peripheral edema, and claudication. Resp: Denies dyspnea at rest, dyspnea with exercise, cough, sputum, wheezing, coughing up blood, and pleurisy. GI: Denies vomiting blood, jaundice, and fecal incontinence.   Denies dysphagia or odynophagia. GU : Denies urinary burning, blood in urine, urinary frequency, urinary hesitancy, nocturnal urination, and urinary incontinence. MS: Denies joint pain, limitation of movement, and swelling, stiffness, low back pain, extremity pain.  Denies muscle weakness, cramps, atrophy.  Derm: Denies rash, itching, dry skin, hives, moles, warts, or unhealing ulcers.  Psych: Denies depression, anxiety, memory loss, suicidal ideation, hallucinations, paranoia, and confusion. Heme: Denies bruising, bleeding, and enlarged lymph nodes. Neuro:  Denies any headaches, dizziness, paresthesias. Endo:  Denies any problems with DM, thyroid, adrenal function.   Physical Exam: Hall:  Alert, well-developed, in NAD Head:  Normocephalic and atraumatic. Eyes:  Sclera clear, no icterus.   Conjunctiva pink. Ears:  Normal auditory acuity. Mouth:  No deformity or lesions.  Neck:  Supple; no masses . Lungs:  Clear throughout to auscultation.   No wheezes, crackles, or rhonchi. No acute distress. Heart:  Regular rate and rhythm; no murmurs. Abdomen:  Soft, nondistended, nontender. No masses, hepatomegaly. No obvious masses.  Normal bowel .    Rectal:  Deferred   Msk:  Symmetrical without gross deformities.. Pulses:  Normal pulses noted. Extremities:  Without edema. Neurologic:  Alert and  oriented x4;  grossly normal neurologically. Skin:  Intact without significant lesions or rashes. Cervical Nodes:  No significant cervical adenopathy. Psych:  Alert and cooperative. Normal mood and affect.   Impression / Plan:   Personal history of multiple sessile serrated polyps and tubular adenomas.  Family history of colon polyps in his sister who had multiple polyps.  For colonoscopy today.   This patient is appropriate for endoscopic procedures in the ambulatory setting.    Pricilla Riffle. Fuller Plan  03/23/2021, 9:03 AM

## 2021-03-23 NOTE — Progress Notes (Signed)
Called to room to assist during endoscopic procedure.  Patient ID and intended procedure confirmed with present staff. Received instructions for my participation in the procedure from the performing physician.  

## 2021-03-23 NOTE — Progress Notes (Signed)
To pacu, VSS. Report to [email or web address] 

## 2021-03-25 ENCOUNTER — Telehealth: Payer: Self-pay

## 2021-03-25 NOTE — Telephone Encounter (Signed)
  Follow up Call-  Call back number 03/23/2021 09/01/2019  Post procedure Call Back phone  # [phone]  Permission to leave phone message Yes Yes  Some recent data might be hidden     Patient questions:  Do you have a fever, pain , or abdominal swelling? No. Pain Score  0 *  Have you tolerated food without any problems? Yes.    Have you been able to return to your normal activities? Yes.    Do you have any questions about your discharge instructions: Diet   No. Medications  No. Follow up visit  No.  Do you have questions or concerns about your Care? No.  Actions: * If pain score is 4 or above: No action needed, pain <4.  Have you developed a fever since your procedure? no  2.   Have you had an respiratory symptoms (SOB or cough) since your procedure? no  3.   Have you tested positive for COVID 19 since your procedure no  4.   Have you had any family members/close contacts diagnosed with the COVID 19 since your procedure?  no   If yes to any of these questions please route to Joylene John, RN and Joella Prince, RN

## 2021-04-07 ENCOUNTER — Encounter: Payer: Self-pay | Admitting: Gastroenterology

## 2021-04-19 ENCOUNTER — Ambulatory Visit: Payer: Managed Care, Other (non HMO) | Admitting: Registered"

## 2021-05-27 ENCOUNTER — Other Ambulatory Visit: Payer: Self-pay

## 2021-05-27 ENCOUNTER — Ambulatory Visit (INDEPENDENT_AMBULATORY_CARE_PROVIDER_SITE_OTHER): Payer: Managed Care, Other (non HMO) | Admitting: Plastic Surgery

## 2021-05-27 ENCOUNTER — Encounter: Payer: Self-pay | Admitting: Plastic Surgery

## 2021-05-27 VITALS — BP 131/80 | HR 106 | Ht 73.0 in | Wt 258.0 lb

## 2021-05-27 DIAGNOSIS — D489 Neoplasm of uncertain behavior, unspecified: Secondary | ICD-10-CM

## 2021-05-27 NOTE — Progress Notes (Signed)
Referring Provider Marin Olp, MD [place],  [place] [address]   CC:  Multiple cystic lesions  Jack Hall. is an 50 y.o. male.  HPI: Is a 50 year old male who presents with multiple cystic lesions.  He has had them removed before under local.  He notes a right cheek lesion, a right posterior scalp lesion, and a left shoulder lesion.  He would like these removed under local.  He is currently smoking and does not think that he can stop right now.  He had a high hemoglobin A1c the last time it was checked and is willing to get this rechecked.  No Known Allergies  Outpatient Encounter Medications as of 05/27/2021  Medication Sig   Accu-Chek Softclix Lancets lancets Use to test blood sugars daily. Dx: E11.9   atorvastatin (LIPITOR) 40 MG tablet TAKE 1 TABLET BY MOUTH EVERY DAY   Blood Glucose Monitoring Suppl (ACCU-CHEK AVIVA PLUS) w/Device KIT Use to test blood sugars daily.Dx: E11.9   Cyanocobalamin (VITAMIN B-12 PO) Take by mouth.   fluticasone (FLONASE) 50 MCG/ACT nasal spray Place into both nostrils as needed.    glucose blood (ACCU-CHEK AVIVA PLUS) test strip Use to test blood sugars daily. Dx: E11.9   ibuprofen (ADVIL) 200 MG tablet Take 400 mg by mouth every 6 (six) hours as needed for headache or mild pain.   metFORMIN (GLUCOPHAGE) 1000 MG tablet Take 1 tablet (1,000 mg total) by mouth 2 (two) times daily with a meal.   Omeprazole Magnesium (PRILOSEC OTC PO) Take 20 mg by mouth as needed.   No facility-administered encounter medications on file as of 05/27/2021.     Past Medical History:  Diagnosis Date   Allergic rhinitis    otc flonase   Allergy    seasonal   Diabetes mellitus without complication (HCC)    GERD (gastroesophageal reflux disease)    prilosec- hiatal hernia   Hiatal hernia    Hyperlipidemia    Inguinal hernia    left   Obesity     Past Surgical History:  Procedure Laterality Date   COLONOSCOPY  09/01/2019    Fuller Plan   WISDOM TOOTH EXTRACTION     general anesthesia    Family History  Problem Relation Age of Onset   Arthritis Mother    Diabetes Mother        grandmother   CAD Father    Sudden death Father        heart attack 66- constant stress, poor food choices, smoked heavier than patient   Colon polyps Sister        11 sessile serrated polyps.    Colon cancer Paternal Grandfather    Cancer Other    Esophageal cancer Neg Hx    Rectal cancer Neg Hx    Stomach cancer Neg Hx     Social History   Social History Narrative   Family: Single- not dating lately. Living alone now. 2020- got new poodle just before Covid 19.       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 No college      Hobbies: time at gym usually 4-5 days a week, friends and family, vacation     Review of Systems General: Denies fevers, chills, weight loss CV: Denies chest pain, shortness of breath, palpitations   Physical Exam Vitals with BMI 05/27/2021 03/23/2021 03/23/2021  Height 6'  1" - -  Weight 258 lbs - -  BMI 24.79 - -  Systolic 980 012 393  Diastolic 80 78 79  Pulse 594 86 86    General:  No acute distress,  Alert and oriented, Non-Toxic, Normal speech and affect HEENT: 1 cm lesion right cheek, mobile, punctum visible.  1 cm lesion right posterior scalp.  Less than 1 cm lesion left shoulder with punctum visible.  Assessment/Plan Patient with multiple cystic lesions which are most likely sebaceous cyst.  These are bothersome to the patient and excision is indicated.  We discussed smoking cessation the patient does not think he can stop smoking but he is willing to get his hemoglobin A1c checked since his last reading was high.  He notes that he has made significant dietary modifications since that high reading.  Time based coding: 16 minutes were spent with the patient.  Greater than 50% was spent on counseling cordination  of care.     Lennice Sites 05/27/2021, 10:42 AM

## 2021-05-30 NOTE — Progress Notes (Signed)
Phone [phone] In person visit   Subjective:   Jack Hall. is a 50 y.o. year old very pleasant male patient who presents for/with See problem oriented charting Chief Complaint  Patient presents with   Diabetes    Pt is wanting to f/u on diabetes/cholesterol since he has been to nutrition and changed eating habits. Wanting labs as a fresh start prior to CPE.   Hyperlipidemia   Gastroesophageal Reflux   This visit occurred during the SARS-CoV-2 public health emergency.  Safety protocols were in place, including screening questions prior to the visit, additional usage of staff PPE, and extensive cleaning of exam room while observing appropriate contact time as indicated for disinfecting solutions.   Past Medical History-  Patient Active Problem List   Diagnosis Date Noted   Poorly controlled type 2 diabetes mellitus ([place]) 07/07/2020    Priority: High   Smoker 05/12/2015    Priority: High   Family history of premature CAD 05/12/2015    Priority: High   Hyperlipidemia associated with type 2 diabetes mellitus ([place]) 01/06/2021    Priority: Medium    Left groin hernia 11/10/2015    Priority: Medium    Obesity     Priority: Medium    Epididymal cyst 05/09/2017    Priority: Low   Allergic rhinitis     Priority: Low   GERD (gastroesophageal reflux disease)     Priority: Low    Medications- reviewed and updated Current Outpatient Medications  Medication Sig Dispense Refill   Accu-Chek Softclix Lancets lancets Use to test blood sugars daily. Dx: E11.9 100 each 3   atorvastatin (LIPITOR) 40 MG tablet TAKE 1 TABLET BY MOUTH EVERY DAY 90 tablet 3   Blood Glucose Monitoring Suppl (ACCU-CHEK AVIVA PLUS) w/Device KIT Use to test blood sugars daily.Dx: E11.9 1 kit 3   Cyanocobalamin (VITAMIN B-12 PO) Take by mouth.     fluticasone (FLONASE) 50 MCG/ACT nasal spray Place into both nostrils as needed.      glucose blood (ACCU-CHEK AVIVA PLUS) test strip Use to test blood  sugars daily. Dx: E11.9 100 each 3   ibuprofen (ADVIL) 200 MG tablet Take 400 mg by mouth every 6 (six) hours as needed for headache or mild pain.     metFORMIN (GLUCOPHAGE) 1000 MG tablet Take 1 tablet (1,000 mg total) by mouth 2 (two) times daily with a meal. 180 tablet 3   Omeprazole Magnesium (PRILOSEC OTC PO) Take 20 mg by mouth as needed.     No current facility-administered medications for this visit.     Objective:  BP 128/84 Comment: retake in office.  Pulse (!) 101   Temp 98.3 F (36.8 C)   Ht _0  (1.854 m)   Wt 261 lb 3.2 oz (118.5 kg)   SpO2 97%   BMI 34.46 kg/m  Gen: NAD, resting comfortably Did not note tachycardia when rechecking blood pressure    Assessment and Plan   # Cyst removal on cheek-  being done plastic surgery soon-was referred by dermatology  # Diabetes S: Medication:Metformin 1000 mg twice daily- no side effects - tried jardiance but felt dizzy and nauseous CBGs- once a week usually around 120-130 which he states is trending down Exercise and diet- nutrition visit since last visit- has cut down on sugars drastically- plus more active but wants to increase. Weight pretty stable.  Lab Results  Component Value Date   HGBA1C 8.1 (A) 06/06/2021   HGBA1C 10.7 (H) 01/06/2021  HGBA1C 8.4 (H) 07/06/2020   A/P: A1c much improved down to 8.1-this is on point-of-care was seen so may be closer to 8.6 based on typical variation from phlebotomy.  Congratulated patient on improvement.  Discussed potential medications-he prefers to work on diet/exercise and recheck in 14 weeks-we will do physical in December still with labs a few weeks ahead not including A1c and we can do a point-of-care A1c at next visit here -Continue metformin along with lifestyle changes  #hyperlipidemia- premature CAD history and father S: Medication:Atorvastatin 40 mg daily Lab Results  Component Value Date   CHOL 142 07/06/2020   HDL 31 (L) 07/06/2020   LDLCALC 76 07/06/2020    LDLDIRECT 125.0 01/26/2016   TRIG 268 (H) 07/06/2020   CHOLHDL 4.6 07/06/2020   A/P: hopefully stable or improved (prefer LDL under 70)- update lipid panel when comes back for labs before physical. Continue current meds for now  # smoking- 1/2 PPD- encouraged cessation - not ready to quit   #colon polyps- had 14 polyps- reaching out to Dr. Fuller Plan. 3 year repeat planned- patient desires 18 months and asks about possible genetic testing they may have discussed per patient   #Paresthesias of unclear etiology  -See last note.  Almost a year of symptoms intermittently -With persistent intermittent issues we opted to do nerve conduction study on the left arm.  Also offered sports medicine or neurology referral but patient would like to start with nerve conduction study -[place] a year now of symptoms- EMG was never scheduled- team will investigate   #Diabetic eye exam-discussed the importance of this-discussed he has private insurance that he can call to schedule-if does not have set up by follow-up we will enter referral  Recommended follow up: Return in about 14 weeks (around 09/12/2021) for follow-up or sooner if needed. -Also keep physical in December -Labs a few days before physical in December planned Future Appointments  Date Time Provider [place]  06/24/2021  8:30 AM [facility]  06/24/2021 10:00 AM Lennice Sites, MD [facility] None  07/01/2021  8:40 AM Marin Olp, MD [facility]  09/23/2021  8:40 AM Yong Channel Brayton Mars, MD [facility]   Lab/Order associations:   ICD-10-CM   1. Poorly controlled type 2 diabetes mellitus (HCC)  E11.65 POCT HgB A1C    2. Hyperlipidemia associated with type 2 diabetes mellitus (HCC)  E11.69 CBC with Differential/Platelet   E78.5 Comprehensive metabolic panel    Lipid panel    3. Screening for prostate cancer  Z12.5 PSA     No orders of the defined types were placed in this encounter.  I,Jada Bradford,acting as a  scribe for Garret Reddish, MD.,have documented all relevant documentation on the behalf of Garret Reddish, MD,as directed by  Garret Reddish, MD while in the presence of Garret Reddish, MD.  I, Garret Reddish, MD, have reviewed all documentation for this visit. The documentation on 06/06/21 for the exam, diagnosis, procedures, and orders are all accurate and complete.  Return precautions advised.  Garret Reddish, MD

## 2021-06-06 ENCOUNTER — Ambulatory Visit (INDEPENDENT_AMBULATORY_CARE_PROVIDER_SITE_OTHER): Payer: Managed Care, Other (non HMO) | Admitting: Family Medicine

## 2021-06-06 ENCOUNTER — Encounter: Payer: Self-pay | Admitting: Family Medicine

## 2021-06-06 ENCOUNTER — Other Ambulatory Visit: Payer: Self-pay

## 2021-06-06 VITALS — BP 128/84 | HR 101 | Temp 98.3°F | Ht 73.0 in | Wt 261.2 lb

## 2021-06-06 DIAGNOSIS — Z125 Encounter for screening for malignant neoplasm of prostate: Secondary | ICD-10-CM

## 2021-06-06 DIAGNOSIS — E785 Hyperlipidemia, unspecified: Secondary | ICD-10-CM

## 2021-06-06 DIAGNOSIS — K219 Gastro-esophageal reflux disease without esophagitis: Secondary | ICD-10-CM

## 2021-06-06 DIAGNOSIS — E1165 Type 2 diabetes mellitus with hyperglycemia: Secondary | ICD-10-CM | POA: Diagnosis not present

## 2021-06-06 DIAGNOSIS — E1169 Type 2 diabetes mellitus with other specified complication: Secondary | ICD-10-CM

## 2021-06-06 DIAGNOSIS — Z23 Encounter for immunization: Secondary | ICD-10-CM

## 2021-06-06 LAB — POCT GLYCOSYLATED HEMOGLOBIN (HGB A1C): Hemoglobin A1C: 8.1 % — AB (ref 4.0–5.6)

## 2021-06-06 NOTE — Patient Instructions (Addendum)
Health Maintenance Due  Topic Date Due   OPHTHALMOLOGY EXAM  - please schedule an eye doctor visit and have them send [place] records Never done   Schedule a lab visit at the check out desk at least a few days before your next visit Return for future fasting labs meaning nothing but water after midnight please. Ok to take your medications with water.    Team please contact lisa for info nerve conduction surgery from 01/06/21.   Love your motivation to exercise! Please try exercising for 150 minutes per week (30 minutes a day)! Or more. Great job on a1c improvement but still have more work to do- lets stay motivated!   Recommended follow up: Return in about 14 weeks (around 09/12/2021) for follow-up or sooner if needed.  Please have a Happy Thanksgiving!

## 2021-06-07 ENCOUNTER — Telehealth: Payer: Self-pay

## 2021-06-07 DIAGNOSIS — Z86010 Personal history of colonic polyps: Secondary | ICD-10-CM

## 2021-06-07 NOTE — Telephone Encounter (Signed)
Left message for patient to call back  

## 2021-06-07 NOTE — Telephone Encounter (Signed)
-----   Message from Ladene Artist, MD sent at 06/06/2021  3:31 PM EST ----- Regarding: RE: colonoscopies Hi Annie Main,  Several of the polyps on his recent colonoscopy were hyperplastic (not precancerous) so he had around 8 precancerous polyps on this recent colonoscopy. Genetic counseling, testing is reasonable and we will refer him. Depending on the findings his follow interval could be shortened however 3 years for his next colonoscopy is within the guidelines.   Namrata Dangler please set up a genetics counseling referral for multiple precancerous colon polyps.   Thanks,   Norberto Sorenson    ----- Message ----- From: Marin Olp, MD Sent: 06/06/2021   1:57 PM EST To: Ladene Artist, MD Subject: colonoscopies                                  Hey Dr. Fuller Plan,   Patient had colonoscopy with you and had 14 polyps. He was advised 3 year repeat.   He is asking if possible to do sooner at 18 months- I told him did not line up with guidelines and insurance may not cover but just wanted to reach out to see if that is accurate.   Also he said you mentioned possible genetic testing due to # of polyps and family history polyps- I didn't see that in your note but he was wondering if still needed ( I told him assume no unless he hears back from me or you).   Thanks for your time and expertise,  Garret Reddish

## 2021-06-13 NOTE — Telephone Encounter (Signed)
Left message for patient to call back  

## 2021-06-14 ENCOUNTER — Telehealth: Payer: Self-pay | Admitting: Genetic Counselor

## 2021-06-14 NOTE — Telephone Encounter (Signed)
Scheduled appt per 11/29 referral. Pt is aware of appt date and time.  

## 2021-06-14 NOTE — Telephone Encounter (Signed)
Patient notified of recommendations  He is aware he will be contacted directly with an appointment date and time for genetic counseling

## 2021-06-14 NOTE — Addendum Note (Signed)
Addended by: Marlon Pel on: 06/14/2021 09:34 AM   Modules accepted: Orders

## 2021-06-24 ENCOUNTER — Ambulatory Visit (INDEPENDENT_AMBULATORY_CARE_PROVIDER_SITE_OTHER): Payer: Managed Care, Other (non HMO) | Admitting: Plastic Surgery

## 2021-06-24 ENCOUNTER — Other Ambulatory Visit (HOSPITAL_COMMUNITY)
Admission: RE | Admit: 2021-06-24 | Discharge: 2021-06-24 | Disposition: A | Discharge status: Home / Self Care | Payer: Managed Care, Other (non HMO) | Source: Ambulatory Visit | Attending: Plastic Surgery | Admitting: Plastic Surgery

## 2021-06-24 ENCOUNTER — Other Ambulatory Visit: Payer: Self-pay

## 2021-06-24 ENCOUNTER — Encounter: Payer: Self-pay | Admitting: Plastic Surgery

## 2021-06-24 ENCOUNTER — Other Ambulatory Visit (INDEPENDENT_AMBULATORY_CARE_PROVIDER_SITE_OTHER): Payer: Managed Care, Other (non HMO)

## 2021-06-24 VITALS — BP 142/93 | HR 92 | Ht 73.0 in | Wt 259.2 lb

## 2021-06-24 DIAGNOSIS — Z125 Encounter for screening for malignant neoplasm of prostate: Secondary | ICD-10-CM

## 2021-06-24 DIAGNOSIS — E785 Hyperlipidemia, unspecified: Secondary | ICD-10-CM | POA: Diagnosis not present

## 2021-06-24 DIAGNOSIS — E1169 Type 2 diabetes mellitus with other specified complication: Secondary | ICD-10-CM | POA: Diagnosis not present

## 2021-06-24 DIAGNOSIS — D489 Neoplasm of uncertain behavior, unspecified: Secondary | ICD-10-CM | POA: Diagnosis not present

## 2021-06-24 LAB — COMPREHENSIVE METABOLIC PANEL
ALT: 24 U/L (ref 0–53)
AST: 18 U/L (ref 0–37)
Albumin: 4.5 g/dL (ref 3.5–5.2)
Alkaline Phosphatase: 48 U/L (ref 39–117)
BUN: 14 mg/dL (ref 6–23)
CO2: 27 mEq/L (ref 19–32)
Calcium: 9.6 mg/dL (ref 8.4–10.5)
Chloride: 101 mEq/L (ref 96–112)
Creatinine, Ser: 0.79 mg/dL (ref 0.40–1.50)
GFR: 104.36 mL/min (ref >60.00)
Glucose, Bld: 162 mg/dL — ABNORMAL HIGH (ref 70–99)
Potassium: 4.2 mEq/L (ref 3.5–5.1)
Sodium: 138 mEq/L (ref 135–145)
Total Bilirubin: 1 mg/dL (ref 0.2–1.2)
Total Protein: 6.9 g/dL (ref 6.0–8.3)

## 2021-06-24 LAB — LIPID PANEL
Cholesterol: 147 mg/dL (ref 0–200)
HDL: 30.9 mg/dL — ABNORMAL LOW
LDL Cholesterol: 98 mg/dL (ref 0–99)
NonHDL: 115.67
Total CHOL/HDL Ratio: 5
Triglycerides: 89 mg/dL (ref 0.0–149.0)
VLDL: 17.8 mg/dL (ref 0.0–40.0)

## 2021-06-24 LAB — CBC WITH DIFFERENTIAL/PLATELET
Basophils Absolute: 0 10*3/uL (ref 0.0–0.1)
Basophils Relative: 0.5 % (ref 0.0–3.0)
Eosinophils Absolute: 0.1 10*3/uL (ref 0.0–0.7)
Eosinophils Relative: 1.8 % (ref 0.0–5.0)
HCT: 47.1 % (ref 39.0–52.0)
Hemoglobin: 16.5 g/dL (ref 13.0–17.0)
Lymphocytes Relative: 33.6 % (ref 12.0–46.0)
Lymphs Abs: 2.1 10*3/uL (ref 0.7–4.0)
MCHC: 35 g/dL (ref 30.0–36.0)
MCV: 92.5 fl (ref 78.0–100.0)
Monocytes Absolute: 0.4 10*3/uL (ref 0.1–1.0)
Monocytes Relative: 7 % (ref 3.0–12.0)
Neutro Abs: 3.5 10*3/uL (ref 1.4–7.7)
Neutrophils Relative %: 57.1 % (ref 43.0–77.0)
Platelets: 191 10*3/uL (ref 150.0–400.0)
RBC: 5.09 Mil/uL (ref 4.22–5.81)
RDW: 13 % (ref 11.5–15.5)
WBC: 6.2 10*3/uL (ref 4.0–10.5)

## 2021-06-24 LAB — PSA: PSA: 0.22 ng/mL (ref 0.10–4.00)

## 2021-06-24 LAB — SURGICAL PATHOLOGY

## 2021-06-24 NOTE — Progress Notes (Signed)
Phone: [phone]    Subjective:  Patient presents today for their annual physical. Chief complaint-noted.   See problem oriented charting- ROS- full  review of systems was completed and negative  Improved paresthesias- numbness sensation  The following were reviewed and entered/updated in epic: Past Medical History:  Diagnosis Date   Allergic rhinitis    otc flonase   Allergy    seasonal   Diabetes mellitus without complication ([place])    GERD (gastroesophageal reflux disease)    prilosec- hiatal hernia   Hiatal hernia    Hyperlipidemia    Inguinal hernia    left   Obesity    Patient Active Problem List   Diagnosis Date Noted   Poorly controlled type 2 diabetes mellitus ([place]) 07/07/2020    Priority: High   Smoker 05/12/2015    Priority: High   Family history of premature CAD 05/12/2015    Priority: High   Hyperlipidemia associated with type 2 diabetes mellitus ([place]) 01/06/2021    Priority: Medium    Left groin hernia 11/10/2015    Priority: Medium    Obesity     Priority: Medium    Epididymal cyst 05/09/2017    Priority: Low   Allergic rhinitis     Priority: Low   GERD (gastroesophageal reflux disease)     Priority: Low   Past Surgical History:  Procedure Laterality Date   COLONOSCOPY  09/01/2019   Fuller Plan   WISDOM TOOTH EXTRACTION     general anesthesia    Family History  Problem Relation Age of Onset   Arthritis Mother    Diabetes Mother        grandmother   CAD Father    Sudden death Father        heart attack 89- constant stress, poor food choices, smoked heavier than patient   Colon polyps Sister        11 sessile serrated polyps.    Colon cancer Paternal Grandfather    Cancer Other    Esophageal cancer Neg Hx    Rectal cancer Neg Hx    Stomach cancer Neg Hx     Medications- reviewed and updated Current Outpatient Medications  Medication Sig Dispense Refill   Accu-Chek Softclix Lancets lancets Use to test blood sugars daily. Dx: E11.9  100 each 3   atorvastatin (LIPITOR) 40 MG tablet TAKE 1 TABLET BY MOUTH EVERY DAY 90 tablet 3   Blood Glucose Monitoring Suppl (ACCU-CHEK AVIVA PLUS) w/Device KIT Use to test blood sugars daily.Dx: E11.9 1 kit 3   Cyanocobalamin (VITAMIN B-12 PO) Take by mouth.     fluticasone (FLONASE) 50 MCG/ACT nasal spray Place into both nostrils as needed.      glucose blood (ACCU-CHEK AVIVA PLUS) test strip Use to test blood sugars daily. Dx: E11.9 100 each 3   ibuprofen (ADVIL) 200 MG tablet Take 400 mg by mouth every 6 (six) hours as needed for headache or mild pain.     metFORMIN (GLUCOPHAGE) 1000 MG tablet TAKE 1 TABLET (1,000 MG TOTAL) BY MOUTH 2 (TWO) TIMES DAILY WITH A MEAL. 180 tablet 3   Omeprazole Magnesium (PRILOSEC OTC PO) Take 20 mg by mouth as needed.     No current facility-administered medications for this visit.    Allergies-reviewed and updated No Known Allergies  Social History   Social History Narrative   Family: Single- not dating lately. Living alone now. 2020- got new poodle just before Covid 19.       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)   Started out right out of school in [place]. No college      Hobbies: time at gym usually 4-5 days a week, friends and family, vacation      Objective:  BP 126/78   Pulse 93   Temp 98.1 F (36.7 C) (Oral)   Ht 6' 1"  (1.854 m)   Wt 259 lb (117.5 kg)   SpO2 96%   BMI 34.17 kg/m  Gen: NAD, resting comfortably HEENT: Mucous membranes are moist. Oropharynx normal Neck: no thyromegaly CV: RRR no murmurs rubs or gallops Lungs: CTAB no crackles, wheeze, rhonchi Abdomen: soft/nontender/nondistended/normal bowel sounds. No rebound or guarding.  Ext: no edema Skin: warm, dry Neuro: grossly normal, moves all extremities, PERRLA    Assessment and Plan:  50 y.o. male presenting for annual physical.  Health Maintenance counseling: 1. Anticipatory guidance: Patient counseled regarding regular  dental exams -q6 months, eye exams-no vision issues but with diabetes strongly encouraged to update,  avoiding smoking and second hand smoke-unfortunately is a current smoker, limiting alcohol to 2 beverages per day- rare social , no illicit drugs- rare marijuana.   2. Risk factor reduction:  Advised patient of need for regular exercise and diet rich and fruits and vegetables to reduce risk of heart attack and stroke.  Exercise- using apple  watch and trying to "close rings" daily - calorie burn goal 820- averaging over 40 mins exercise per day.  Diet/weight management-weight down slightly from last visit but up 2 pounds from last year Wt Readings from Last 3 Encounters:  07/01/21 259 lb (117.5 kg)  06/24/21 259 lb 3.2 oz (117.6 kg)  06/06/21 261 lb 3.2 oz (118.5 kg)  3. Immunizations/screenings/ancillary studies-up-to-date  Immunization History  Administered Date(s) Administered   Influenza Whole 05/03/2019   Influenza,inj,Quad PF,6+ Mos 05/05/2021   Influenza-Unspecified 04/30/2017, 04/22/2018, 05/05/2020   PFIZER(Purple Top)SARS-COV-2 Vaccination 10/02/2019, 10/23/2019, 07/02/2020   Pfizer Covid-19 Vaccine Bivalent Booster 48yr & up 05/06/2021   Tdap 11/10/2015   4. Prostate cancer screening- low risk psa trend for prostate cancer  Lab Results  Component Value Date   PSA 0.22 06/24/2021   PSA 0.29 07/06/2020   PSA 0.22 06/30/2019   5. Colon cancer screening - last colonoscopy 03/23/2021-Notes from Dr. SFuller Planwhen I reached out "8 precancerous polyps on this recent colonoscopy. Genetic counseling, testing is reasonable and we will refer him. Depending on the findings his follow interval could be shortened however 3 years for his next colonoscopy is within the guidelines. "  GI was to refer him to genetic counseling- he states he was called about this 6. Skin cancer screening/prevention- dermatology yearly. advised regular sunscreen use. Denies worrisome, changing, or new skin lesions.  7.  Testicular cancer screening- advised monthly self exams - no changes in epididymal cyst 8. STD screening- patient opts out-not currently sexually active 9. Smoking associated screening-current smoker- we will monitor UA with labs. Next year likely lung cancer screening  Status of chronic or acute concerns   # Cyst removal on cheek-   done  since last visit by plastic surgery -was referred by dermatology- stitches to be removed today   # Diabetes S: Medication:Metformin 1000 mg twice daily- no side effects - tried jardiance but felt dizzy and nauseous -We have discussed advancing medication at last visit but he wanted to continue to work on diet and exercise since he has been able to get it down from 10.7  CBGs-typically checks once a week and at last visit was 120-130-around the same  Exercise and diet-he had a nutrition visit prior to last visit and was finding that helpful.  Down 2 pounds from last visit-congratulated on efforts especially with the holidays  Lab Results  Component Value Date   HGBA1C point-of-care 8.1 (A) 06/06/2021   HGBA1C 10.7 (H) 01/06/2021   HGBA1C 8.4 (H) 07/06/2020   A/P: poor control last check but improving. Continue current meds and follow up in 3 months -Advised to take diabetic eye exam last visit and again today   #hyperlipidemia- premature CAD history and father S: Medication:Atorvastatin 40 mg daily Lab Results  Component Value Date   CHOL 147 06/24/2021   HDL 30.90 (L) 06/24/2021   LDLCALC 98 06/24/2021   LDLDIRECT 125.0 01/26/2016   TRIG 89.0 06/24/2021   CHOLHDL 5 06/24/2021   A/P: Cholesterol was above goal with bad cholesterol at 98 and with diabetes we discussed stopping atorvastatin 40 mg and starting rosuvastatin 40 mg--hed like to see how this does with continued lifestyle changes    # smoking- 1/2 PPD or less- encouraged cessation - not ready to quit  #Paresthesias of unclear etiology  -See last note.  Almost a year of symptoms  intermittently -With persistent intermittent issues we opted to do nerve conduction study on the left arm.  Also offered sports medicine or neurology referral but patient wanted to start with nerve conduction study - 11/22 visit,almost a year now of symptoms- EMG was never scheduled- team will investigate from last visit- he still did not hear- I messaged our referral coordinator directly Lattie Haw- and she will research   Recommended follow up: keep visit in march  Future Appointments  Date Time Provider [place]  07/01/2021 11:00 AM Lennice Sites, MD [facility] None  07/12/2021 11:00 AM Flippin, Mammie Lorenzo, Counselor [facility] None  07/12/2021 12:00 PM [facility] LAB [facility] None  07/29/2021  1:00 PM Lennice Sites, MD [facility] None  08/05/2021  8:30 AM Lennice Sites, MD [facility] None  09/16/2021  1:00 PM Marin Olp, MD [facility]    Lab/Order associations: fasting    ICD-10-CM   1. Preventative health care  Z00.00     2. Poorly controlled type 2 diabetes mellitus ([place])  E11.65     3. Hyperlipidemia associated with type 2 diabetes mellitus ([place])  E11.69    E78.5     4. Smoker  F17.200 POCT Urinalysis Dipstick (Automated)    5. Paresthesias  R20.2       No orders of the defined types were placed in this encounter.  I,Jada Bradford,acting as a scribe for Garret Reddish, MD.,have documented all relevant documentation on the behalf of Garret Reddish, MD,as directed by  Garret Reddish, MD while in the presence of Garret Reddish, MD.  I, Garret Reddish, MD, have reviewed all documentation for this visit. The documentation on 07/01/21 for the exam, diagnosis, procedures, and orders are all accurate and complete.  Return precautions advised.  Garret Reddish, MD

## 2021-06-24 NOTE — Progress Notes (Signed)
Operative Note   DATE OF OPERATION: 06/24/2021  LOCATION:    SURGICAL DEPARTMENT: Plastic Surgery  PREOPERATIVE DIAGNOSES:  right cheek cystic mass  POSTOPERATIVE DIAGNOSES:  same  PROCEDURE:  Excision of right cheek cystic mass 1.5 cm 1.6  cm intermediate closure  SURGEON: Lynell Greenhouse P. Halden Phegley, MD  ANESTHESIA:  Local  COMPLICATIONS: None.   INDICATIONS FOR PROCEDURE:  The patient, Jack Hall is a 51 y.o. male born on 1971-02-01, is here for treatment of right cheek lesion. MRN: [id number]  CONSENT:  Informed consent was obtained directly from the patient. Risks, benefits and alternatives were fully discussed. Specific risks including but not limited to bleeding, infection, hematoma, seroma, scarring, pain, infection, wound healing problems, and need for further surgery were all discussed. The patient did have an ample opportunity to have questions answered to satisfaction.   DESCRIPTION OF PROCEDURE:  Local anesthesia was administered. The patient's operative site was prepped and draped in a sterile fashion. A time out was performed and all information was confirmed to be correct.  The lesion was excised with a 15 blade using a narrow ellipse followed by sharp and blunt dissection with scissors to remove the cystic mass.  Hemostasis was obtained.  Circumferential undermining was performed and the skin was advanced and closed in layers with interrupted buried Monocryl sutures and 5-0 Prolene for the skin.  The lesion excised measured 1.5 cm, and the total length of closure measured 1.6 cm.  The lesion was sent to pathology.  The patient tolerated the procedure well.  There were no complications.

## 2021-06-28 ENCOUNTER — Other Ambulatory Visit: Payer: Self-pay | Admitting: Family Medicine

## 2021-07-01 ENCOUNTER — Ambulatory Visit (INDEPENDENT_AMBULATORY_CARE_PROVIDER_SITE_OTHER): Payer: Managed Care, Other (non HMO) | Admitting: Family Medicine

## 2021-07-01 ENCOUNTER — Encounter: Payer: Self-pay | Admitting: Plastic Surgery

## 2021-07-01 ENCOUNTER — Other Ambulatory Visit: Payer: Self-pay

## 2021-07-01 ENCOUNTER — Ambulatory Visit (INDEPENDENT_AMBULATORY_CARE_PROVIDER_SITE_OTHER): Payer: Managed Care, Other (non HMO) | Admitting: Plastic Surgery

## 2021-07-01 ENCOUNTER — Encounter: Payer: Self-pay | Admitting: Family Medicine

## 2021-07-01 VITALS — BP 126/78 | HR 93 | Temp 98.1°F | Ht 73.0 in | Wt 259.0 lb

## 2021-07-01 DIAGNOSIS — E1165 Type 2 diabetes mellitus with hyperglycemia: Secondary | ICD-10-CM

## 2021-07-01 DIAGNOSIS — E1169 Type 2 diabetes mellitus with other specified complication: Secondary | ICD-10-CM | POA: Diagnosis not present

## 2021-07-01 DIAGNOSIS — L720 Epidermal cyst: Secondary | ICD-10-CM

## 2021-07-01 DIAGNOSIS — R202 Paresthesia of skin: Secondary | ICD-10-CM

## 2021-07-01 DIAGNOSIS — F17200 Nicotine dependence, unspecified, uncomplicated: Secondary | ICD-10-CM | POA: Diagnosis not present

## 2021-07-01 DIAGNOSIS — E785 Hyperlipidemia, unspecified: Secondary | ICD-10-CM

## 2021-07-01 DIAGNOSIS — Z0000 Encounter for general adult medical examination without abnormal findings: Secondary | ICD-10-CM

## 2021-07-01 DIAGNOSIS — Z8249 Family history of ischemic heart disease and other diseases of the circulatory system: Secondary | ICD-10-CM

## 2021-07-01 LAB — POC URINALSYSI DIPSTICK (AUTOMATED)
Bilirubin, UA: NEGATIVE
Blood, UA: NEGATIVE
Glucose, UA: POSITIVE — AB
Ketones, UA: NEGATIVE
Leukocytes, UA: NEGATIVE
Nitrite, UA: NEGATIVE
Protein, UA: NEGATIVE
Spec Grav, UA: 1.01 (ref 1.010–1.025)
Urobilinogen, UA: NEGATIVE E.U./dL — AB
pH, UA: 6 (ref 5.0–8.0)

## 2021-07-01 NOTE — Patient Instructions (Addendum)
°  We will call you within two weeks about your referral to CT Cardiac Scoring. If you do not hear within 2 weeks, give [place] a call.   Our referral coordinator is also checking on EMG study  Great job on losing weight! Please keep up the great work!   Please stop by lab before you go- urine only  If you have mychart- we will send your results within 3 business days of [place] receiving them.  If you do not have mychart- we will call you about results within 5 business days of [place] receiving them.  *please also note that you will see labs on mychart as soon as they post. I will later go in and write notes on them- will say "notes from Dr. Yong Channel"  Recommended follow up: Keep already schedule appointment next year.   Happy Holidays!

## 2021-07-01 NOTE — Progress Notes (Signed)
S/p excision of right cheek lesion Doing well  PE Incision c/d/I  Pathology:  epidermal cyst  A/P Pathology benign. Patient to followup for removal of a right neck mass.

## 2021-07-12 ENCOUNTER — Inpatient Hospital Stay: Payer: Managed Care, Other (non HMO)

## 2021-07-12 ENCOUNTER — Other Ambulatory Visit: Payer: Self-pay

## 2021-07-12 ENCOUNTER — Inpatient Hospital Stay: Payer: Managed Care, Other (non HMO) | Attending: Diagnostic Radiology | Admitting: Genetic Counselor

## 2021-07-12 DIAGNOSIS — Z86010 Personal history of colonic polyps: Secondary | ICD-10-CM

## 2021-07-12 DIAGNOSIS — Z8042 Family history of malignant neoplasm of prostate: Secondary | ICD-10-CM | POA: Diagnosis not present

## 2021-07-12 DIAGNOSIS — Z8041 Family history of malignant neoplasm of ovary: Secondary | ICD-10-CM | POA: Diagnosis not present

## 2021-07-12 LAB — GENETIC SCREENING ORDER

## 2021-07-13 ENCOUNTER — Encounter: Payer: Self-pay | Admitting: Genetic Counselor

## 2021-07-13 DIAGNOSIS — Z860100 Personal history of colon polyps, unspecified: Secondary | ICD-10-CM

## 2021-07-13 HISTORY — DX: Personal history of colon polyps, unspecified: Z86.0100

## 2021-07-13 HISTORY — DX: Personal history of colonic polyps: Z86.010

## 2021-07-13 NOTE — Progress Notes (Signed)
REFERRING PROVIDER: Ladene Artist, MD [address], [place] [address]  PRIMARY PROVIDER:  Marin Olp, MD  PRIMARY REASON FOR VISIT:  Encounter Diagnosis  Name Primary?   History of colonic polyps     HISTORY OF PRESENT ILLNESS:   Mr. Rayburn, a 50 y.o. male, was seen for a [facility] cancer genetics consultation at the request of Dr. Fuller Plan due to a personal and family history of colon polyps.  Mr. Deskin presents to clinic today to discuss the possibility of a hereditary predisposition to cancer, to discuss genetic testing, and to further clarify his future cancer risks, as well as potential cancer risks for family members.   Mr. Babich has had two colonoscopies, one in 08/2019 and his second in 03/2021. Approximately 7 tubular adenomas and more than 40 polyps in total have been identified.  CANCER HISTORY:  Mr. Seto does not have a personal history of cancer.  Past Medical History:  Diagnosis Date   Allergic rhinitis    otc flonase   Allergy    seasonal   Diabetes mellitus without complication (HCC)    GERD (gastroesophageal reflux disease)    prilosec- hiatal hernia   Hiatal hernia    Hyperlipidemia    Inguinal hernia    left   Obesity     Past Surgical History:  Procedure Laterality Date   COLONOSCOPY  09/01/2019   Fuller Plan   WISDOM TOOTH EXTRACTION     general anesthesia    Social History   Socioeconomic History   Marital status: Single    Spouse name: Not on file   Number of children: Not on file   Years of education: Not on file   Highest education level: Not on file  Occupational History   Not on file  Tobacco Use   Smoking status: Every Day    Packs/day: 0.50    Types: Cigarettes   Smokeless tobacco: Never  Vaping Use   Vaping Use: Never used  Substance and Sexual Activity   Alcohol use: Yes    Alcohol/week: 5.0 - 8.0 standard drinks    Types: 2 - 5 Standard drinks or equivalent, 2 Glasses of wine, 1 Shots of liquor per  week   Drug use: Not Currently    Types: Marijuana    Comment: rare marijuana   Sexual activity: Not on file  Other Topics Concern   Not on file  Social History Narrative   Family: Single- not dating lately. Living alone now. 2020- got new poodle just before Covid 19.       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 No college      Hobbies: time at gym usually 4-5 days a week, friends and family, vacation   Social Determinants of Health   Financial Resource Strain: Not on file  Food Insecurity: Not on file  Transportation Needs: Not on file  Physical Activity: Not on file  Stress: Not on file  Social Connections: Not on file     FAMILY HISTORY:  We obtained a detailed, 4-generation family history.  Significant diagnoses are listed below: Family History  Problem Relation Age of Onset   Arthritis Mother    Diabetes Mother        grandmother   CAD Father    Sudden death Father        heart attack 41- constant stress, poor  food choices, smoked heavier than patient   Colon polyps Sister        11 sessile serrated polyps.    Cancer Paternal Uncle        unknown type   Kidney cancer Maternal Grandfather    Prostate cancer Paternal Grandfather       Mr. Maiden sister has had 71 polyps to date. His maternal grandfather was diagnosed with kidney cancer in his 76s and is deceased. His paternal uncle was diagnosed with cancer (unknown type) in his 62s and is deceased. His paternal grandfather was diagnosed with prostate cancer in his 36s and died in his late 33s. Mr. Palladino is unaware of previous family history of genetic testing for hereditary cancer risks. There is no reported Ashkenazi Jewish ancestry.  GENETIC COUNSELING ASSESSMENT: Mr. Mcomber is a 50 y.o. male with a personal and family history of colon polyps which is somewhat suggestive of a hereditary predisposition to colon  polyps/cancer. We, therefore, discussed and recommended the following at today's visit.   DISCUSSION: We discussed that polyposis may be hereditary, with most cases of polyposis associated with APC and MUTYH.  There are other genes that can be associated with hereditary polyposis syndromes.  We discussed that testing is beneficial for several reasons including knowing how to follow individuals and understanding if other family members could be at risk for cancer and allowing them to undergo genetic testing.   We reviewed the characteristics, features and inheritance patterns of hereditary cancer syndromes. We also discussed genetic testing, including the appropriate family members to test, the process of testing, insurance coverage and turn-around-time for results. We discussed the implications of a negative, positive, carrier and/or variant of uncertain significant result. We recommended Mr. Biglow pursue genetic testing for a panel that includes genes associated with polyposis.   Mr. Saran was offered a polyposis panel, a common hereditary cancer panel (47 genes) and an expanded pan-cancer panel (84 genes). Mr. Schwartz was informed of the benefits and limitations of each panel, including that expanded pan-cancer panels contain genes that do not have clear management guidelines at this point in time.  We also discussed that as the number of genes included on a panel increases, the chances of variants of uncertain significance increases.  After considering the benefits and limitations of each gene panel, Mr. Hanley elected to have [facility]'s Multi-Cancer Panel+RNA.  The Multi-Cancer + RNA Panel offered by [facility] includes sequencing and/or deletion/duplication analysis of the following 84 genes:  AIP*, ALK, APC*, ATM*, AXIN2*, BAP1*, BARD1*, BLM*, BMPR1A*, BRCA1*, BRCA2*, BRIP1*, CASR, CDC73*, CDH1*, CDK4, CDKN1B*, CDKN1C*, CDKN2A, CEBPA, CHEK2*, CTNNA1*, DICER1*, DIS3L2*, EGFR, EPCAM, FH*, FLCN*, GATA2*,  GPC3, GREM1, HOXB13, HRAS, KIT, MAX*, MEN1*, MET, MITF, MLH1*, MSH2*, MSH3*, MSH6*, MUTYH*, NBN*, NF1*, NF2*, NTHL1*, PALB2*, PDGFRA, PHOX2B, PMS2*, POLD1*, POLE*, POT1*, PRKAR1A*, PTCH1*, PTEN*, RAD50*, RAD51C*, RAD51D*, RB1*, RECQL4, RET, RUNX1*, SDHA*, SDHAF2*, SDHB*, SDHC*, SDHD*, SMAD4*, SMARCA4*, SMARCB1*, SMARCE1*, STK11*, SUFU*, TERC, TERT, TMEM127*, Tp53*, TSC1*, TSC2*, VHL*, WRN*, and WT1.  RNA analysis is performed for * genes.  While Mr. Graves has had over 60 polyps, only 7 have been tubular adenomas. However, as he is only 50 years old and he has a family history of colon polyps, considering genetic testing is appropriate. He may have an out of pocket cost. We discussed that if his out of pocket cost for testing is over $100, the laboratory will call and confirm whether he wants to proceed with testing.  If the out of pocket cost of  testing is less than $100 he will be billed by the genetic testing laboratory.   PLAN: After considering the risks, benefits, and limitations, Mr. Lesch provided informed consent to pursue genetic testing and the blood sample was sent to [facility] for analysis of the Multi-Cancer Panel+RNA. Results should be available within approximately 2-3 weeks' time, at which point they will be disclosed by telephone to Mr. Walthall, as will any additional recommendations warranted by these results. Mr. Utz will receive a summary of his genetic counseling visit and a copy of his results once available. This information will also be available in Epic.   Mr. Sarver questions were answered to his satisfaction today. Our contact information was provided should additional questions or concerns arise. Thank you for the referral and allowing [place] to share in the care of your patient.   Lucille Passy, MS, [facility] Genetic Counselor [email or web address] (P) [phone]  The patient was seen for a total of 35 minutes in face-to-face genetic counseling. The  patient was seen alone.  Drs. Lindi Adie and/or Burr Medico were available to discuss this case as needed.   _______________________________________________________________________ For Office Staff:  Number of people involved in session: 1 Was an Intern/ student involved with case: no

## 2021-07-29 ENCOUNTER — Ambulatory Visit: Payer: Managed Care, Other (non HMO) | Admitting: Plastic Surgery

## 2021-08-01 ENCOUNTER — Encounter: Payer: Self-pay | Admitting: Genetic Counselor

## 2021-08-01 ENCOUNTER — Ambulatory Visit: Payer: Self-pay | Admitting: Genetic Counselor

## 2021-08-01 DIAGNOSIS — Z1379 Encounter for other screening for genetic and chromosomal anomalies: Secondary | ICD-10-CM

## 2021-08-01 HISTORY — DX: Encounter for other screening for genetic and chromosomal anomalies: Z13.79

## 2021-08-01 NOTE — Progress Notes (Signed)
HPI:   Mr. Larose was previously seen in the [facility] clinic due to a personal and family history of colon polyps and concerns regarding a hereditary predisposition to polyposis/cancer. Please refer to our prior cancer genetics clinic note for more information regarding our discussion, assessment and recommendations, at the time. Mr. Hamm recent genetic test results were disclosed to him, as were recommendations warranted by these results. These results and recommendations are discussed in more detail below.  CANCER HISTORY:  Oncology History   No history exists.    FAMILY HISTORY:  We obtained a detailed, 4-generation family history.  Significant diagnoses are listed below:      Family History  Problem Relation Age of Onset   Arthritis Mother     Diabetes Mother          grandmother   CAD Father     Sudden death Father          heart attack 27- constant stress, poor food choices, smoked heavier than patient   Colon polyps Sister          30 sessile serrated polyps.    Cancer Paternal Uncle          unknown type   Kidney cancer Maternal Grandfather     Prostate cancer Paternal Grandfather           Mr. Parker sister has had 85 polyps to date. His maternal grandfather was diagnosed with kidney cancer in his 51s and is deceased. His paternal uncle was diagnosed with cancer (unknown type) in his 5s and is deceased. His paternal grandfather was diagnosed with prostate cancer in his 73s and died in his late 9s. Mr. Youngblood is unaware of previous family history of genetic testing for hereditary cancer risks. There is no reported Ashkenazi Jewish ancestry.  GENETIC TEST RESULTS:  The [facility] Multi-Cancer Panel found no pathogenic mutations.   The Multi-Cancer + RNA Panel offered by [facility] includes sequencing and/or deletion/duplication analysis of the following 84 genes:  AIP*, ALK, APC*, ATM*, AXIN2*, BAP1*, BARD1*, BLM*, BMPR1A*, BRCA1*, BRCA2*, BRIP1*,  CASR, CDC73*, CDH1*, CDK4, CDKN1B*, CDKN1C*, CDKN2A, CEBPA, CHEK2*, CTNNA1*, DICER1*, DIS3L2*, EGFR, EPCAM, FH*, FLCN*, GATA2*, GPC3, GREM1, HOXB13, HRAS, KIT, MAX*, MEN1*, MET, MITF, MLH1*, MSH2*, MSH3*, MSH6*, MUTYH*, NBN*, NF1*, NF2*, NTHL1*, PALB2*, PDGFRA, PHOX2B, PMS2*, POLD1*, POLE*, POT1*, PRKAR1A*, PTCH1*, PTEN*, RAD50*, RAD51C*, RAD51D*, RB1*, RECQL4, RET, RUNX1*, SDHA*, SDHAF2*, SDHB*, SDHC*, SDHD*, SMAD4*, SMARCA4*, SMARCB1*, SMARCE1*, STK11*, SUFU*, TERC, TERT, TMEM127*, Tp53*, TSC1*, TSC2*, VHL*, WRN*, and WT1.  RNA analysis is performed for * genes.  The test report has been scanned into EPIC and is located under the Molecular Pathology section of the Results Review tab.  A portion of the result report is included below for reference. Genetic testing reported out on 07/28/2021.      Even though a pathogenic variant was not identified, possible explanations for the colon polyps in the family may include: There may be no hereditary risk for colon polyps in the family. The colon polyps in Mr. Petron and/or his family may be due to other genetic or environmental factors. There may be a gene mutation in one of these genes that current testing methods cannot detect, but that chance is small. There could be another gene that has not yet been discovered, or that we have not yet tested, that is responsible for the colon polyps in the family.   Therefore, it is important to remain in touch with cancer genetics in the future  so that we can continue to offer Mr. Capriotti the most up to date genetic testing.   ADDITIONAL GENETIC TESTING:  We discussed with Mr. Aburto that his genetic testing was fairly extensive.  If there are genes identified to increase colon polyps/cancer risk that can be analyzed in the future, we would be happy to discuss and coordinate this testing at that time.    CANCER SCREENING RECOMMENDATIONS:  Mr. Nguyenthi test result is considered negative (normal). This means that  we have not identified a hereditary cause for his personal and family history of colon polyps at this time.   An individual's colon polyp/cancer risk and medical management are not determined by genetic test results alone. Overall risk assessment incorporates additional factors, including personal medical history, family history, and any available genetic information that may result in a personalized plan for cancer prevention and surveillance. Therefore, it is recommended he continue to follow the cancer management and screening guidelines provided by his primary healthcare provider.  RECOMMENDATIONS FOR FAMILY MEMBERS:   Other members of the family may still carry a pathogenic variant in one of these genes that Mr. Sinkfield did not inherit. Based on the family history, we recommend his sister have genetic counseling and testing.   FOLLOW-UP:  Cancer genetics is a rapidly advancing field and it is possible that new genetic tests will be appropriate for him and/or his family members in the future. We encouraged him to remain in contact with cancer genetics on an annual basis so we can update his personal and family histories and let him know of advances in cancer genetics that may benefit this family.   Our contact number was provided. Mr. Cass questions were answered to his satisfaction, and he knows he is welcome to call us at anytime with additional questions or concerns.   Lucille Passy, MS, [facility] Genetic Counselor [email or web address] (P) [phone]

## 2021-08-02 ENCOUNTER — Encounter: Payer: Self-pay | Admitting: Family Medicine

## 2021-08-03 ENCOUNTER — Ambulatory Visit (INDEPENDENT_AMBULATORY_CARE_PROVIDER_SITE_OTHER)
Admission: RE | Admit: 2021-08-03 | Discharge: 2021-08-03 | Disposition: A | Discharge status: Home / Self Care | Payer: Self-pay | Source: Ambulatory Visit | Attending: Family Medicine | Admitting: Family Medicine

## 2021-08-03 ENCOUNTER — Other Ambulatory Visit: Payer: Self-pay

## 2021-08-03 ENCOUNTER — Encounter: Payer: Self-pay | Admitting: Family Medicine

## 2021-08-03 DIAGNOSIS — E785 Hyperlipidemia, unspecified: Secondary | ICD-10-CM

## 2021-08-03 DIAGNOSIS — E1169 Type 2 diabetes mellitus with other specified complication: Secondary | ICD-10-CM

## 2021-08-03 DIAGNOSIS — Z8249 Family history of ischemic heart disease and other diseases of the circulatory system: Secondary | ICD-10-CM

## 2021-08-04 ENCOUNTER — Encounter: Payer: Self-pay | Admitting: Family Medicine

## 2021-08-04 ENCOUNTER — Other Ambulatory Visit: Payer: Self-pay

## 2021-08-04 MED ORDER — FREESTYLE LIBRE 3 SENSOR MISC: 1.0000 | Freq: Once | 3 refills | Status: AC

## 2021-08-04 MED ORDER — FREESTYLE LIBRE 2 READER DEVI: 1.0000 | Freq: Every day | 3 refills | Status: AC

## 2021-08-05 ENCOUNTER — Other Ambulatory Visit (HOSPITAL_COMMUNITY)
Admission: RE | Admit: 2021-08-05 | Discharge: 2021-08-05 | Disposition: A | Discharge status: Home / Self Care | Payer: BC Managed Care – PPO | Source: Ambulatory Visit | Attending: Plastic Surgery | Admitting: Plastic Surgery

## 2021-08-05 ENCOUNTER — Ambulatory Visit: Payer: Managed Care, Other (non HMO) | Admitting: Plastic Surgery

## 2021-08-05 ENCOUNTER — Other Ambulatory Visit: Payer: Self-pay

## 2021-08-05 ENCOUNTER — Encounter: Payer: Self-pay | Admitting: Plastic Surgery

## 2021-08-05 ENCOUNTER — Ambulatory Visit (INDEPENDENT_AMBULATORY_CARE_PROVIDER_SITE_OTHER): Payer: BC Managed Care – PPO | Admitting: Plastic Surgery

## 2021-08-05 VITALS — BP 150/98 | HR 86 | Ht 73.0 in | Wt 257.8 lb

## 2021-08-05 DIAGNOSIS — D489 Neoplasm of uncertain behavior, unspecified: Secondary | ICD-10-CM | POA: Insufficient documentation

## 2021-08-05 DIAGNOSIS — E785 Hyperlipidemia, unspecified: Secondary | ICD-10-CM

## 2021-08-05 DIAGNOSIS — R221 Localized swelling, mass and lump, neck: Secondary | ICD-10-CM | POA: Diagnosis not present

## 2021-08-05 MED ORDER — ROSUVASTATIN CALCIUM 40 MG PO TABS: 40.0000 mg | ORAL_TABLET | Freq: Every day | ORAL | 3 refills | Status: DC

## 2021-08-05 NOTE — Progress Notes (Signed)
Operative Note   DATE OF OPERATION: 08/05/2021  LOCATION:    SURGICAL DEPARTMENT: Plastic Surgery  PREOPERATIVE DIAGNOSES: Left neck mass  POSTOPERATIVE DIAGNOSES:  same  PROCEDURE:  Excision of 1.5 cm left neck mass 1.5 cm intermediate closure left neck  SURGEON: Jerry Clyne P. Anvika Gashi, MD  ANESTHESIA:  Local  COMPLICATIONS: None.   INDICATIONS FOR PROCEDURE:  The patient, Jack Hall is a 51 y.o. male born on [birth year], is here for treatment of left neck mass.  Patient also was interested in removing a posterior right neck mass but this was very difficult to palpate today so we will do this when the lesion is more prominent. MRN: [id number]  CONSENT:  Informed consent was obtained directly from the patient. Risks, benefits and alternatives were fully discussed. Specific risks including but not limited to bleeding, infection, hematoma, seroma, scarring, pain, infection, wound healing problems, and need for further surgery were all discussed. The patient did have an ample opportunity to have questions answered to satisfaction.   DESCRIPTION OF PROCEDURE:  Local anesthesia was administered. The patient's operative site was prepped and draped in a sterile fashion. A time out was performed and all information was confirmed to be correct.  The lesion was excised with a 15 blade.  Hemostasis was obtained.  Circumferential undermining was performed and the skin was advanced and closed in layers with interrupted buried Monocryl sutures and Prolene for the skin.  The lesion excised measured 1.5 cm, and the total length of closure measured 1.5 cm.    The patient tolerated the procedure well.  There were no complications.

## 2021-08-09 LAB — SURGICAL PATHOLOGY

## 2021-08-12 ENCOUNTER — Other Ambulatory Visit: Payer: Self-pay

## 2021-08-12 ENCOUNTER — Ambulatory Visit (INDEPENDENT_AMBULATORY_CARE_PROVIDER_SITE_OTHER): Payer: Managed Care, Other (non HMO) | Admitting: Plastic Surgery

## 2021-08-12 DIAGNOSIS — L723 Sebaceous cyst: Secondary | ICD-10-CM

## 2021-08-12 NOTE — Progress Notes (Signed)
Patient is status post excision of left neck cyst.  Physical exam Incisions clean dry and intact, no erythema  Pathology: Consistent with sebaceous cyst  Assessment and plan Sutures removed we will see the patient back if his posterior right neck lesion recurs.

## 2021-08-16 DIAGNOSIS — D224 Melanocytic nevi of scalp and neck: Secondary | ICD-10-CM | POA: Diagnosis not present

## 2021-08-16 DIAGNOSIS — L821 Other seborrheic keratosis: Secondary | ICD-10-CM | POA: Diagnosis not present

## 2021-08-16 DIAGNOSIS — L814 Other melanin hyperpigmentation: Secondary | ICD-10-CM | POA: Diagnosis not present

## 2021-08-16 DIAGNOSIS — L570 Actinic keratosis: Secondary | ICD-10-CM | POA: Diagnosis not present

## 2021-08-16 DIAGNOSIS — D225 Melanocytic nevi of trunk: Secondary | ICD-10-CM | POA: Diagnosis not present

## 2021-08-22 DIAGNOSIS — K449 Diaphragmatic hernia without obstruction or gangrene: Secondary | ICD-10-CM | POA: Insufficient documentation

## 2021-08-22 DIAGNOSIS — T7840XA Allergy, unspecified, initial encounter: Secondary | ICD-10-CM | POA: Insufficient documentation

## 2021-08-22 DIAGNOSIS — E119 Type 2 diabetes mellitus without complications: Secondary | ICD-10-CM | POA: Insufficient documentation

## 2021-08-26 ENCOUNTER — Ambulatory Visit (INDEPENDENT_AMBULATORY_CARE_PROVIDER_SITE_OTHER): Payer: BC Managed Care – PPO | Admitting: Cardiology

## 2021-08-26 ENCOUNTER — Encounter: Payer: Self-pay | Admitting: Cardiology

## 2021-08-26 ENCOUNTER — Other Ambulatory Visit: Payer: Self-pay

## 2021-08-26 VITALS — BP 124/64 | HR 93 | Ht 73.0 in | Wt 257.0 lb

## 2021-08-26 DIAGNOSIS — R931 Abnormal findings on diagnostic imaging of heart and coronary circulation: Secondary | ICD-10-CM

## 2021-08-26 DIAGNOSIS — E785 Hyperlipidemia, unspecified: Secondary | ICD-10-CM

## 2021-08-26 DIAGNOSIS — Z8249 Family history of ischemic heart disease and other diseases of the circulatory system: Secondary | ICD-10-CM

## 2021-08-26 DIAGNOSIS — F17210 Nicotine dependence, cigarettes, uncomplicated: Secondary | ICD-10-CM | POA: Diagnosis not present

## 2021-08-26 DIAGNOSIS — E119 Type 2 diabetes mellitus without complications: Secondary | ICD-10-CM

## 2021-08-26 DIAGNOSIS — R011 Cardiac murmur, unspecified: Secondary | ICD-10-CM

## 2021-08-26 DIAGNOSIS — E66811 Obesity, class 1: Secondary | ICD-10-CM | POA: Insufficient documentation

## 2021-08-26 DIAGNOSIS — E1169 Type 2 diabetes mellitus with other specified complication: Secondary | ICD-10-CM | POA: Diagnosis not present

## 2021-08-26 DIAGNOSIS — E669 Obesity, unspecified: Secondary | ICD-10-CM | POA: Insufficient documentation

## 2021-08-26 DIAGNOSIS — F17200 Nicotine dependence, unspecified, uncomplicated: Secondary | ICD-10-CM

## 2021-08-26 HISTORY — DX: Abnormal findings on diagnostic imaging of heart and coronary circulation: R93.1

## 2021-08-26 MED ORDER — ASPIRIN EC 81 MG PO TBEC: 81.0000 mg | DELAYED_RELEASE_TABLET | Freq: Every day | ORAL | 3 refills | Status: AC

## 2021-08-26 NOTE — Patient Instructions (Signed)
Medication Instructions:  Your physician recommends that you continue on your current medications as directed. Please refer to the Current Medication list given to you today.  *If you need a refill on your cardiac medications before your next appointment, please call your pharmacy*   Lab Work: None ordered If you have labs (blood work) drawn today and your tests are completely normal, you will receive your results only by: MyChart Message (if you have MyChart) OR A paper copy in the mail If you have any lab test that is abnormal or we need to change your treatment, we will call you to review the results.   Testing/Procedures: Your physician has requested that you have a lexiscan myoview. For further information please visit [email or web address]. Please follow instruction sheet, as given.  The test will take approximately 3 to 4 hours to complete; you may bring reading material.  If someone comes with you to your appointment, they will need to remain in the main lobby due to limited space in the testing area. **If you are pregnant or breastfeeding, please notify the nuclear lab prior to your appointment**  How to prepare for your Myocardial Perfusion Test: Do not eat or drink 3 hours prior to your test, except you may have water. Do not consume products containing caffeine (regular or decaffeinated) 12 hours prior to your test. (ex: coffee, chocolate, sodas, tea). Do bring a list of your current medications with you.  If not listed below, you may take your medications as normal. Do wear comfortable clothes (no dresses or overalls) and walking shoes, tennis shoes preferred (No heels or open toe shoes are allowed). Do NOT wear cologne, perfume, aftershave, or lotions (deodorant is allowed). If these instructions are not followed, your test will have to be rescheduled.  Your physician has requested that you have an echocardiogram. Echocardiography is a painless test that uses sound waves to  create images of your heart. It provides your doctor with information about the size and shape of your heart and how well your heart's chambers and valves are working. This procedure takes approximately one hour. There are no restrictions for this procedure.   Follow-Up: At [facility], you and your health needs are our priority.  As part of our continuing mission to provide you with exceptional heart care, we have created designated Provider Care Teams.  These Care Teams include your primary Cardiologist (physician) and Advanced Practice Providers (APPs -  Physician Assistants and Nurse Practitioners) who all work together to provide you with the care you need, when you need it.  We recommend signing up for the patient portal called "MyChart".  Sign up information is provided on this After Visit Summary.  MyChart is used to connect with patients for Virtual Visits (Telemedicine).  Patients are able to view lab/test results, encounter notes, upcoming appointments, etc.  Non-urgent messages can be sent to your provider as well.   To learn more about what you can do with MyChart, go to [email or web address].    Your next appointment:   6 month(s)  The format for your next appointment:   In Person  Provider:   Rajan Revankar, MD   Other Instructions Cardiac Nuclear Scan A cardiac nuclear scan is a test that is done to check the flow of blood to your heart. It is done when you are resting and when you are exercising. The test looks for problems such as: Not enough blood reaching a portion of the heart. The heart muscle not working as   it should. You may need this test if: You have heart disease. You have had lab results that are not normal. You have had heart surgery or a balloon procedure to open up blocked arteries (angioplasty). You have chest pain. You have shortness of breath. In this test, a special dye (tracer) is put into your bloodstream. The tracer will travel to your heart. A  camera will then take pictures of your heart to see how the tracer moves through your heart. This test is usually done at a hospital and takes 2-4 hours. Tell a doctor about: Any allergies you have. All medicines you are taking, including vitamins, herbs, eye drops, creams, and over-the-counter medicines. Any problems you or family members have had with anesthetic medicines. Any blood disorders you have. Any surgeries you have had. Any medical conditions you have. Whether you are pregnant or may be pregnant. What are the risks? Generally, this is a safe test. However, problems may occur, such as: Serious chest pain and heart attack. This is only a risk if the stress portion of the test is done. Rapid heartbeat. A feeling of warmth in your chest. This feeling usually does not last long. Allergic reaction to the tracer. What happens before the test? Ask your doctor about changing or stopping your normal medicines. This is important. Follow instructions from your doctor about what you cannot eat or drink. Remove your jewelry on the day of the test. What happens during the test? An IV tube will be inserted into one of your veins. Your doctor will give you a small amount of tracer through the IV tube. You will wait for 20-40 minutes while the tracer moves through your bloodstream. Your heart will be monitored with an electrocardiogram (ECG). You will lie down on an exam table. Pictures of your heart will be taken for about 15-20 minutes. You may also have a stress test. For this test, one of these things may be done: You will be asked to exercise on a treadmill or a stationary bike. You will be given medicines that will make your heart work harder. This is done if you are unable to exercise. When blood flow to your heart has peaked, a tracer will again be given through the IV tube. After 20-40 minutes, you will get back on the exam table. More pictures will be taken of your heart. Depending  on the tracer that is used, more pictures may need to be taken 3-4 hours later. Your IV tube will be removed when the test is over. The test may vary among doctors and hospitals. What happens after the test? Ask your doctor: Whether you can return to your normal schedule, including diet, activities, and medicines. Whether you should drink more fluids. This will help to remove the tracer from your body. Drink enough fluid to keep your pee (urine) pale yellow. Ask your doctor, or the department that is doing the test: When will my results be ready? How will I get my results? Summary A cardiac nuclear scan is a test that is done to check the flow of blood to your heart. Tell your doctor whether you are pregnant or may be pregnant. Before the test, ask your doctor about changing or stopping your normal medicines. This is important. Ask your doctor whether you can return to your normal activities. You may be asked to drink more fluids. This information is not intended to replace advice given to you by your health care provider. Make sure you discuss any   questions you have with your health care provider. Document Revised: 10/23/2018 Document Reviewed: 12/17/2017 Elsevier Patient Education  2021 Elsevier Inc.    Echocardiogram An echocardiogram is a test that uses sound waves (ultrasound) to produce images of the heart. Images from an echocardiogram can provide important information about: Heart size and shape. The size and thickness and movement of your heart's walls. Heart muscle function and strength. Heart valve function or if you have stenosis. Stenosis is when the heart valves are too narrow. If blood is flowing backward through the heart valves (regurgitation). A tumor or infectious growth around the heart valves. Areas of heart muscle that are not working well because of poor blood flow or injury from a heart attack. Aneurysm detection. An aneurysm is a weak or damaged part of an  artery wall. The wall bulges out from the normal force of blood pumping through the body. Tell a health care provider about: Any allergies you have. All medicines you are taking, including vitamins, herbs, eye drops, creams, and over-the-counter medicines. Any blood disorders you have. Any surgeries you have had. Any medical conditions you have. Whether you are pregnant or may be pregnant. What are the risks? Generally, this is a safe test. However, problems may occur, including an allergic reaction to dye (contrast) that may be used during the test. What happens before the test? No specific preparation is needed. You may eat and drink normally. What happens during the test? You will take off your clothes from the waist up and put on a hospital gown. Electrodes or electrocardiogram (ECG)patches may be placed on your chest. The electrodes or patches are then connected to a device that monitors your heart rate and rhythm. You will lie down on a table for an ultrasound exam. A gel will be applied to your chest to help sound waves pass through your skin. A handheld device, called a transducer, will be pressed against your chest and moved over your heart. The transducer produces sound waves that travel to your heart and bounce back (or "echo" back) to the transducer. These sound waves will be captured in real-time and changed into images of your heart that can be viewed on a video monitor. The images will be recorded on a computer and reviewed by your health care provider. You may be asked to change positions or hold your breath for a short time. This makes it easier to get different views or better views of your heart. In some cases, you may receive contrast through an IV in one of your veins. This can improve the quality of the pictures from your heart. The procedure may vary among health care providers and hospitals.    What can I expect after the test? You may return to your normal, everyday  life, including diet, activities, and medicines, unless your health care provider tells you not to do that. Follow these instructions at home: It is up to you to get the results of your test. Ask your health care provider, or the department that is doing the test, when your results will be ready. Keep all follow-up visits. This is important. Summary An echocardiogram is a test that uses sound waves (ultrasound) to produce images of the heart. Images from an echocardiogram can provide important information about the size and shape of your heart, heart muscle function, heart valve function, and other possible heart problems. You do not need to do anything to prepare before this test. You may eat and drink normally. After   the echocardiogram is completed, you may return to your normal, everyday life, unless your health care provider tells you not to do that. This information is not intended to replace advice given to you by your health care provider. Make sure you discuss any questions you have with your health care provider. Document Revised: 02/24/2020 Document Reviewed: 02/24/2020 Elsevier Patient Education  2021 Elsevier Inc.  

## 2021-08-26 NOTE — Progress Notes (Signed)
Cardiology Office Note:    Date:  08/26/2021   ID:  Jack Hall., DOB 19-Aug-1970, MRN [id number]  PCP:  Marin Olp, MD  Cardiologist:  Jenean Lindau, MD   Referring MD: Marin Olp, MD    ASSESSMENT:    1. Hyperlipidemia associated with type 2 diabetes mellitus ([place])   2. Diabetes mellitus without complication ([place])   3. Family history of premature CAD   4. Obesity (BMI 30.0-34.9)   5. Smoker   6. Elevated coronary artery calcium score    PLAN:    In order of problems listed above:  Elevated calcium score: Dyspnea on exertion: Secondary prevention stressed with patient.  Importance of compliance with diet and medications testing vocalized understanding.  I would like to do an exercise stress Cardiolite to understand his symptoms especially in view of the elevated calcium score and he is agreeable. Cardiac murmur: Echocardiogram will be done to assess murmur heard on auscultation.   Essential hypertension: Blood pressure stable and diet was emphasized. Mixed dyslipidemia and diabetes mellitus: This is markedly elevated.  He is on statin therapy.  Hemoglobin A1c is elevated and he is doing his best with diet to reduce weight and also reduce carbs in the diet.  I emphasized the importance of this. Obesity: Weight reduction stressed his obesity explained and he promises to do better. Cigarette smoker: I spent 5 minutes with the patient discussing solely about smoking. Smoking cessation was counseled. I suggested to the patient also different medications and pharmacological interventions. Patient is keen to try stopping on its own at this time. He will get back to me if he needs any further assistance in this matter. Patient will be seen in follow-up appointment in 6 months or earlier if the patient has any concerns    Medication Adjustments/Labs and Tests Ordered: Current medicines are reviewed at length with the patient today.  Concerns regarding medicines  are outlined above.  No orders of the defined types were placed in this encounter.  No orders of the defined types were placed in this encounter.    History of Present Illness:    Jack Hall. is a 51 y.o. male who is being seen today for the evaluation of elevated calcium score at the request of Marin Olp, MD. patient is a pleasant 51 year old male.  He has past medical history of mixed dyslipidemia, diabetes mellitus obesity and smoking.  Patient mentions to me that he has no chest pain but has some chest discomfort at times.  This is not related to exertion.  He leads a sedentary lifestyle.  He does have dyspnea on exertion.  At the time of my evaluation, the patient is alert awake oriented and in no distress.  He is concerned about elevated calcium score findings.  Past Medical History:  Diagnosis Date   Allergic rhinitis    otc flonase   Allergy    seasonal   Diabetes mellitus without complication ([employer])    Epididymal cyst 05/09/2017   Felt mass in scrotum- saw urgent care "1. No testicular mass or specific findings of torsion or orchitis. 2. Single bilateral epididymal cysts or spermatoceles, 1.2 cm on the right and 0.5 cm on the left. 3. Small bilateral hydroceles."   Family history of premature CAD 05/20/2015   Father died of heart attack at age 71. Saw cardiology 12/16/15 who did stress test and no ischemia noted   Genetic testing 08/01/2021   [facility] Multi-Cancer Panel  was Negative. Report date is 07/28/2021.  The Multi-Cancer + RNA Panel offered by [facility] includes sequencing and/or deletion/duplication analysis of the following 84 genes:  AIP*, ALK, APC*, ATM*, AXIN2*, BAP1*, BARD1*, BLM*, BMPR1A*, BRCA1*, BRCA2*, BRIP1*, CASR, CDC73*, CDH1*, CDK4, CDKN1B*, CDKN1C*, CDKN2A, CEBPA, CHEK2*, CTNNA1*, DICER1*, DIS3L2*, EGFR, EPCAM, FH*, F   GERD (gastroesophageal reflux disease)    prilosec- hiatal hernia   Hiatal hernia    History of colonic polyps 07/13/2021    Hyperlipidemia associated with type 2 diabetes mellitus ([place]) 01/06/2021   Left groin hernia 11/10/2015   May call for surgical referral in future-    Obesity    Poorly controlled type 2 diabetes mellitus ([place]) 07/07/2020   New diagnosis December 2021   Smoker 05/12/2015   1/2 PPD    Past Surgical History:  Procedure Laterality Date   COLONOSCOPY  09/01/2019   [place] [facility]   WISDOM TOOTH EXTRACTION     general anesthesia    Current Medications: Current Meds  Medication Sig   Accu-Chek Softclix Lancets lancets Use to test blood sugars daily. Dx: E11.9   Blood Glucose Monitoring Suppl (ACCU-CHEK AVIVA PLUS) w/Device KIT Use to test blood sugars daily.Dx: E11.9   Continuous Blood Gluc Receiver (FREESTYLE LIBRE 2 READER) DEVI 1 each by Does not apply route daily at 2 PM. Use to check blood sugars. Dx: E11.9   Cyanocobalamin (VITAMIN B-12 PO) Take by mouth.   fluticasone (FLONASE) 50 MCG/ACT nasal spray Place into both nostrils as needed.    glucose blood (ACCU-CHEK AVIVA PLUS) test strip Use to test blood sugars daily. Dx: E11.9   ibuprofen (ADVIL) 200 MG tablet Take 400 mg by mouth every 6 (six) hours as needed for headache or mild pain.   metFORMIN (GLUCOPHAGE) 1000 MG tablet TAKE 1 TABLET (1,000 MG TOTAL) BY MOUTH 2 (TWO) TIMES DAILY WITH A MEAL.   mupirocin ointment (BACTROBAN) 2 % Apply 1 application topically 2 (two) times daily.   Omeprazole Magnesium (PRILOSEC OTC PO) Take 20 mg by mouth as needed.   rosuvastatin (CRESTOR) 40 MG tablet Take 1 tablet (40 mg total) by mouth daily.     Allergies:   Patient has no known allergies.   Social History   Socioeconomic History   Marital status: Single    Spouse name: Not on file   Number of children: Not on file   Years of education: Not on file   Highest education level: Not on file  Occupational History   Not on file  Tobacco Use   Smoking status: Every Day    Packs/day: 0.50    Types: Cigarettes   Smokeless tobacco: Never   Vaping Use   Vaping Use: Never used  Substance and Sexual Activity   Alcohol use: Yes    Alcohol/week: 5.0 - 8.0 standard drinks    Types: 2 - 5 Standard drinks or equivalent, 2 Glasses of wine, 1 Shots of liquor per week   Drug use: Not Currently    Types: Marijuana    Comment: rare marijuana   Sexual activity: Not on file  Other Topics Concern   Not on file  Social History Narrative   Family: Single- not dating lately. Living alone now. 2020- got new poodle just before Covid 19.       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 No college      Hobbies: time at gym  usually 4-5 days a week, friends and family, vacation   Social Determinants of Health   Financial Resource Strain: Not on file  Food Insecurity: Not on file  Transportation Needs: Not on file  Physical Activity: Not on file  Stress: Not on file  Social Connections: Not on file     Family History: The patient's family history includes Arthritis in his mother; CAD in his father; Cancer in his paternal uncle; Colon polyps in his sister; Diabetes in his mother; Kidney cancer in his maternal grandfather; Prostate cancer in his paternal grandfather; Sudden death in his father. There is no history of Esophageal cancer, Rectal cancer, or Stomach cancer.  ROS:   Please see the history of present illness.    All other systems reviewed and are negative.  EKGs/Labs/Other Studies Reviewed:    The following studies were reviewed today: EKG reveals sinus rhythm with nonspecific ST-T changes   Recent Labs: 06/24/2021: ALT 24; BUN 14; Creatinine, Ser 0.79; Hemoglobin 16.5; Platelets 191.0; Potassium 4.2; Sodium 138  Recent Lipid Panel    Component Value Date/Time   CHOL 147 06/24/2021 0836   TRIG 89.0 06/24/2021 0836   HDL 30.90 (L) 06/24/2021 0836   CHOLHDL 5 06/24/2021 0836   VLDL 17.8 06/24/2021 0836   LDLCALC 98 06/24/2021 0836    LDLCALC 76 07/06/2020 0849   LDLDIRECT 125.0 01/26/2016 0844    Physical Exam:    VS:  BP 124/64    Pulse 93    Ht 6' 1" (1.854 m)    Wt 257 lb (116.6 kg)    SpO2 96%    BMI 33.91 kg/m     Wt Readings from Last 3 Encounters:  08/26/21 257 lb (116.6 kg)  08/05/21 257 lb 12.8 oz (116.9 kg)  07/01/21 259 lb (117.5 kg)     GEN: Patient is in no acute distress HEENT: Normal NECK: No JVD; No carotid bruits LYMPHATICS: No lymphadenopathy CARDIAC: S1 S2 regular, 2/6 systolic murmur at the apex. RESPIRATORY:  Clear to auscultation without rales, wheezing or rhonchi  ABDOMEN: Soft, non-tender, non-distended MUSCULOSKELETAL:  No edema; No deformity  SKIN: Warm and dry NEUROLOGIC:  Alert and oriented x 3 PSYCHIATRIC:  Normal affect    Signed, Jenean Lindau, MD  08/26/2021 4:39 PM    [facility]

## 2021-09-07 ENCOUNTER — Telehealth (HOSPITAL_COMMUNITY): Payer: Self-pay | Admitting: *Deleted

## 2021-09-07 NOTE — Telephone Encounter (Signed)
Patient given detailed instructions per Myocardial Perfusion Study Information Sheet for the test on 09/14/2021 at 8:15. Patient notified to arrive 15 minutes early and that it is imperative to arrive on time for appointment to keep from having the test rescheduled.  If you need to cancel or reschedule your appointment, please call the office within 24 hours of your appointment. . Patient verbalized understanding.Jack Hall

## 2021-09-14 ENCOUNTER — Ambulatory Visit (INDEPENDENT_AMBULATORY_CARE_PROVIDER_SITE_OTHER): Payer: BC Managed Care – PPO

## 2021-09-14 ENCOUNTER — Other Ambulatory Visit: Payer: Self-pay

## 2021-09-14 DIAGNOSIS — R931 Abnormal findings on diagnostic imaging of heart and coronary circulation: Secondary | ICD-10-CM

## 2021-09-14 DIAGNOSIS — Z8249 Family history of ischemic heart disease and other diseases of the circulatory system: Secondary | ICD-10-CM | POA: Diagnosis not present

## 2021-09-14 DIAGNOSIS — R011 Cardiac murmur, unspecified: Secondary | ICD-10-CM

## 2021-09-14 LAB — MYOCARDIAL PERFUSION IMAGING
Angina Index: 0
Duke Treadmill Score: 12
Estimated workload: 13.4
Exercise duration (min): 12 min
Exercise duration (sec): 4 s
LV dias vol: 131 mL (ref 62–150)
LV sys vol: 52 mL
MPHR: 171 {beats}/min
Nuc Stress EF: 61 %
Peak HR: 153 {beats}/min
Percent HR: 89 %
Rest HR: 74 {beats}/min
Rest Nuclear Isotope Dose: 10.8 mCi
SDS: 7
SRS: 7
SSS: 14
ST Depression (mm): 0 mm
Stress Nuclear Isotope Dose: 31 mCi
TID: 0.91

## 2021-09-14 LAB — ECHOCARDIOGRAM COMPLETE
Area-P 1/2: 3.85 cm2
Height: 73 in
S' Lateral: 3.4 cm
Weight: 4112 oz

## 2021-09-14 MED ORDER — TECHNETIUM TC 99M TETROFOSMIN IV KIT
10.8000 | PACK | Freq: Once | INTRAVENOUS | Status: AC | PRN
  Administered 2021-09-14: 10.8 via INTRAVENOUS

## 2021-09-14 MED ORDER — TECHNETIUM TC 99M TETROFOSMIN IV KIT
31.0000 | PACK | Freq: Once | INTRAVENOUS | Status: AC | PRN
  Administered 2021-09-14: 31 via INTRAVENOUS

## 2021-09-15 ENCOUNTER — Telehealth: Payer: Self-pay

## 2021-09-15 NOTE — Telephone Encounter (Signed)
Patient notified of results. Results sent to PCP ?

## 2021-09-15 NOTE — Telephone Encounter (Signed)
-----   Message from Jenean Lindau, MD sent at 09/15/2021  8:50 AM EST ----- ?The results of the study is unremarkable. Please inform patient. I will discuss in detail at next appointment. Cc  primary care/referring physician ?Jenean Lindau, MD 09/15/2021 8:50 AM  ?

## 2021-09-16 ENCOUNTER — Other Ambulatory Visit: Payer: Self-pay

## 2021-09-16 ENCOUNTER — Ambulatory Visit (INDEPENDENT_AMBULATORY_CARE_PROVIDER_SITE_OTHER): Payer: BC Managed Care – PPO | Admitting: Family Medicine

## 2021-09-16 ENCOUNTER — Encounter: Payer: Self-pay | Admitting: Neurology

## 2021-09-16 ENCOUNTER — Encounter: Payer: Self-pay | Admitting: Family Medicine

## 2021-09-16 VITALS — BP 138/84 | HR 84 | Temp 98.8°F | Ht 73.0 in | Wt 252.8 lb

## 2021-09-16 DIAGNOSIS — E785 Hyperlipidemia, unspecified: Secondary | ICD-10-CM

## 2021-09-16 DIAGNOSIS — I2510 Atherosclerotic heart disease of native coronary artery without angina pectoris: Secondary | ICD-10-CM

## 2021-09-16 DIAGNOSIS — F17200 Nicotine dependence, unspecified, uncomplicated: Secondary | ICD-10-CM | POA: Diagnosis not present

## 2021-09-16 DIAGNOSIS — E1165 Type 2 diabetes mellitus with hyperglycemia: Secondary | ICD-10-CM

## 2021-09-16 DIAGNOSIS — R202 Paresthesia of skin: Secondary | ICD-10-CM

## 2021-09-16 HISTORY — DX: Atherosclerotic heart disease of native coronary artery without angina pectoris: I25.10

## 2021-09-16 LAB — COMPREHENSIVE METABOLIC PANEL
ALT: 16 U/L (ref 0–53)
AST: 15 U/L (ref 0–37)
Albumin: 4.7 g/dL (ref 3.5–5.2)
Alkaline Phosphatase: 46 U/L (ref 39–117)
BUN: 13 mg/dL (ref 6–23)
CO2: 29 mEq/L (ref 19–32)
Calcium: 9.6 mg/dL (ref 8.4–10.5)
Chloride: 101 mEq/L (ref 96–112)
Creatinine, Ser: 0.77 mg/dL (ref 0.40–1.50)
GFR: 105 mL/min (ref >60.00)
Glucose, Bld: 127 mg/dL — ABNORMAL HIGH (ref 70–99)
Potassium: 3.9 mEq/L (ref 3.5–5.1)
Sodium: 137 mEq/L (ref 135–145)
Total Bilirubin: 0.5 mg/dL (ref 0.2–1.2)
Total Protein: 6.7 g/dL (ref 6.0–8.3)

## 2021-09-16 LAB — HEMOGLOBIN A1C: Hgb A1c MFr Bld: 6.6 % — ABNORMAL HIGH (ref 4.6–6.5)

## 2021-09-16 LAB — LDL CHOLESTEROL, DIRECT: Direct LDL: 47 mg/dL

## 2021-09-16 NOTE — Patient Instructions (Addendum)
Have your eye exam scheduled and have them fax results to [phone] or Sign release of information at the check out desk for last diabetic eye exam ? ?Please stop by lab before you go ?If you have mychart- we will send your results within 3 business days of [place] receiving them.  ?If you do not have mychart- we will call you about results within 5 business days of [place] receiving them.  ?*please also note that you will see labs on mychart as soon as they post. I will later go in and write notes on them- will say "notes from Dr. Yong Channel"  ? ?We will call you within two weeks about your referral to neurology. If you do not hear within 2 weeks, give [place] a call.  ? ?Great job on weight loss and cutting down on smoking- continue your efforts!  ? ?Recommended follow up: Return in about 4 months (around 01/16/2022) for follow up- or sooner if needed.  ?

## 2021-09-16 NOTE — Progress Notes (Signed)
? ?Phone [phone] ?In person visit ?  ?Subjective:  ? ?Jack Hall. is a 51 y.o. year old very pleasant male patient who presents for/with See problem oriented charting ?Chief Complaint  ?Patient presents with  ? Follow-up  ? Diabetes  ? Hyperlipidemia  ? ?This visit occurred during the SARS-CoV-2 public health emergency.  Safety protocols were in place, including screening questions prior to the visit, additional usage of staff PPE, and extensive cleaning of exam room while observing appropriate contact time as indicated for disinfecting solutions.  ? ?Past Medical History-  ?Patient Active Problem List  ? Diagnosis Date Noted  ? Nonobstructive CAD 09/16/2021  ?  Priority: High  ? Elevated coronary artery calcium score 08/26/2021  ?  Priority: High  ? Poorly controlled type 2 diabetes mellitus ([place]) 07/07/2020  ?  Priority: High  ? Smoker 05/12/2015  ?  Priority: High  ? Family history of premature CAD 05/12/2015  ?  Priority: High  ? Genetic testing 08/01/2021  ?  Priority: Medium   ? Hyperlipidemia associated with type 2 diabetes mellitus ([place]) 01/06/2021  ?  Priority: Medium   ? Left groin hernia 11/10/2015  ?  Priority: Medium   ? Obesity   ?  Priority: Medium   ? Allergy 08/22/2021  ?  Priority: Low  ? Hiatal hernia 08/22/2021  ?  Priority: Low  ? History of colonic polyps 07/13/2021  ?  Priority: Low  ? Epididymal cyst 05/09/2017  ?  Priority: Low  ? Allergic rhinitis   ?  Priority: Low  ? GERD (gastroesophageal reflux disease)   ?  Priority: Low  ? ? ?Medications- reviewed and updated ?Current Outpatient Medications  ?Medication Sig Dispense Refill  ? Accu-Chek Softclix Lancets lancets Use to test blood sugars daily. Dx: E11.9 100 each 3  ? aspirin EC 81 MG tablet Take 1 tablet (81 mg total) by mouth daily. Swallow whole. 90 tablet 3  ? Blood Glucose Monitoring Suppl (ACCU-CHEK AVIVA PLUS) w/Device KIT Use to test blood sugars daily.Dx: E11.9 1 kit 3  ? Continuous Blood Gluc Receiver  (FREESTYLE LIBRE 2 READER) DEVI 1 each by Does not apply route daily at 2 PM. Use to check blood sugars. Dx: E11.9 1 each 3  ? Cyanocobalamin (VITAMIN B-12 PO) Take by mouth.    ? fluticasone (FLONASE) 50 MCG/ACT nasal spray Place into both nostrils as needed.     ? glucose blood (ACCU-CHEK AVIVA PLUS) test strip Use to test blood sugars daily. Dx: E11.9 100 each 3  ? ibuprofen (ADVIL) 200 MG tablet Take 400 mg by mouth every 6 (six) hours as needed for headache or mild pain.    ? metFORMIN (GLUCOPHAGE) 1000 MG tablet TAKE 1 TABLET (1,000 MG TOTAL) BY MOUTH 2 (TWO) TIMES DAILY WITH A MEAL. 180 tablet 3  ? mupirocin ointment (BACTROBAN) 2 % Apply 1 application topically 2 (two) times daily.    ? Omeprazole Magnesium (PRILOSEC OTC PO) Take 20 mg by mouth as needed.    ? rosuvastatin (CRESTOR) 40 MG tablet Take 1 tablet (40 mg total) by mouth daily. 90 tablet 3  ? ?No current facility-administered medications for this visit.  ? ?  ?Objective:  ?BP 138/84   Pulse 84   Temp 98.8 ?F (37.1 ?C)   Ht _0  (1.854 m)   Wt 252 lb 12.8 oz (114.7 kg)   SpO2 96%   BMI 33.35 kg/m?  ?Gen: NAD, resting comfortably ?CV: RRR no murmurs rubs  or gallops ?Lungs: CTAB no crackles, wheeze, rhonchi ?Ext: no edema ?Skin: warm, dry  ?  ? ?Assessment and Plan  ? ?# Diabetes-peak A1c 10.7 in June 2022 ?S: Medication:Metformin 1000 mg twice daily- no side effects ?- tried jardiance but felt dizzy and nauseous ?-We had discussed advancing medication at last visit but he wanted to continue to work on diet and exercise since he had been able to get it down from 10.7 ?CBGs- has libre 3 but hasnt used yet ?Exercise and diet- walking for exercise for 7 days a week and thinking about lifting. Down 7 lbs since last visit- sugars have been cut out and improving diet overall ?Lab Results  ?Component Value Date  ? HGBA1C 8.1 (A) 06/06/2021  ? HGBA1C 10.7 (H) 01/06/2021  ? HGBA1C 8.4 (H) 07/06/2020  ? A/P: Poor control on last check but  improving-hoping for further improvement today-update A1c with labs-we discussed if A1c not under 7.5 considering ozempic or mounjaro most heavily ? ?#hyperlipidemia- premature CAD history and father ?#Nonobstructive CAD-follows with Dr. Geraldo Pitter ?S: Medication:Rosuvastatin 40 mg daily (use to be Atorvastatin 40 mg daily) ?-On 08/03/2021-Coronary calcium score of 39.4. This was 25 percentile for age ?-Low-grade stress test on 09/14/2021 ?-Echocardiogram for murmur done 09/14/2021-no cause of murmur found but borderline dilation of the ascending aorta at 37 mm was noted  ?Lab Results  ?Component Value Date  ? CHOL 147 06/24/2021  ? HDL 30.90 (L) 06/24/2021  ? [place] 98 06/24/2021  ? LDLDIRECT 125.0 01/26/2016  ? TRIG 89.0 06/24/2021  ? CHOLHDL 5 06/24/2021  ? A/P: lipids hopefully improving- want LDL under 70 - update LDL with labs today as well as cmp ? ?Nonobstructive CAD- no chest pain or shortness of breath- monitor and continue cardiology follow up ?-monitor BP- higher than lats visit- he admits to some stress with all medical appointments ?  ?# smoking- 1/2 PPD or less- encouraged cessation particularly now with nonobstructive CAD/elevated coronary artery calcium scoring- down to 1/4 PPD since last visit- wants to quit fully- plans to continue to cut down ?  ?#Paresthesias of unclear etiology - in left hand ?-See last note.  Almost a year of symptoms intermittently ?-With persistent intermittent issues we opted to do nerve conduction study on the left arm.  Also offered sports medicine or neurology referral but patient wanted to start with nerve conduction study ?- 11/22 visit-almost a year now of symptoms- EMG was never scheduled- team will investigate from last visit- he still did not hear- I messaged our referral coordinator directly Lattie Haw she was to research ?-at this point with inability to get this scheduled by our referral coordinator I am going to refer to neurology for their opinion ? ?Recommended follow  up: Return in about 4 months (around 01/16/2022) for follow up- or sooner if needed. ?Future Appointments  ?Date Time Provider [place]  ?03/15/2022  8:00 AM Revankar, Reita Cliche, MD [facility] None  ? ? ?Lab/Order associations: ?  ICD-10-CM   ?1. Poorly controlled type 2 diabetes mellitus (HCC)  E11.65 Comprehensive metabolic panel  ?  Hemoglobin A1c  ?  ?2. Hyperlipidemia, unspecified hyperlipidemia type  E78.5 LDL cholesterol, direct  ?  ?3. Current smoker  F17.200   ?  ?4. Paresthesias  R20.2 Ambulatory referral to Neurology  ?  ?5. Coronary artery disease involving native coronary artery of native heart without angina pectoris  I25.10   ?  ? ?No orders of the defined types were placed in this encounter. ? ?  I,Jada Bradford,acting as a scribe for Garret Reddish, MD.,have documented all relevant documentation on the behalf of Garret Reddish, MD,as directed by  Garret Reddish, MD while in the presence of Garret Reddish, MD. ? ?I, Garret Reddish, MD, have reviewed all documentation for this visit. The documentation on 09/16/21 for the exam, diagnosis, procedures, and orders are all accurate and complete. ? ?Return precautions advised.  ?Garret Reddish, MD ? ? ? ? ?

## 2021-09-23 ENCOUNTER — Ambulatory Visit: Payer: Managed Care, Other (non HMO) | Admitting: Family Medicine

## 2021-10-12 ENCOUNTER — Ambulatory Visit: Payer: BC Managed Care – PPO | Admitting: Cardiology

## 2022-01-10 ENCOUNTER — Encounter: Payer: Self-pay | Admitting: Family Medicine

## 2022-01-10 ENCOUNTER — Ambulatory Visit (INDEPENDENT_AMBULATORY_CARE_PROVIDER_SITE_OTHER): Payer: BC Managed Care – PPO | Admitting: Family Medicine

## 2022-01-10 VITALS — BP 138/84 | HR 87 | Temp 98.7°F | Ht 73.0 in | Wt 253.2 lb

## 2022-01-10 DIAGNOSIS — I2510 Atherosclerotic heart disease of native coronary artery without angina pectoris: Secondary | ICD-10-CM

## 2022-01-10 DIAGNOSIS — E785 Hyperlipidemia, unspecified: Secondary | ICD-10-CM

## 2022-01-10 DIAGNOSIS — E1169 Type 2 diabetes mellitus with other specified complication: Secondary | ICD-10-CM | POA: Diagnosis not present

## 2022-01-10 DIAGNOSIS — E669 Obesity, unspecified: Secondary | ICD-10-CM

## 2022-01-10 LAB — POCT GLYCOSYLATED HEMOGLOBIN (HGB A1C): Hemoglobin A1C: 7.3 % — AB (ref 4.0–5.6)

## 2022-01-10 NOTE — Progress Notes (Signed)
Phone [phone] In person visit   Subjective:   Jack Hall. is a 51 y.o. year old very pleasant male patient who presents for/with See problem oriented charting Chief Complaint  Patient presents with   Follow-up   Diabetes   Gastroesophageal Reflux    Past Medical History-  Patient Active Problem List   Diagnosis Date Noted   Nonobstructive CAD 09/16/2021    Priority: High   Elevated coronary artery calcium score 08/26/2021    Priority: High   Poorly controlled type 2 diabetes mellitus (HCC) 07/07/2020    Priority: High   Smoker 05/12/2015    Priority: High   Family history of premature CAD 05/12/2015    Priority: High   Genetic testing 08/01/2021    Priority: Medium    Hyperlipidemia associated with type 2 diabetes mellitus (HCC) 01/06/2021    Priority: Medium    Left groin hernia 11/10/2015    Priority: Medium    Obesity     Priority: Medium    Allergy 08/22/2021    Priority: Low   Hiatal hernia 08/22/2021    Priority: Low   History of colonic polyps 07/13/2021    Priority: Low   Epididymal cyst 05/09/2017    Priority: Low   Allergic rhinitis     Priority: Low   GERD (gastroesophageal reflux disease)     Priority: Low    Medications- reviewed and updated Current Outpatient Medications  Medication Sig Dispense Refill   Accu-Chek Softclix Lancets lancets Use to test blood sugars daily. Dx: E11.9 100 each 3   aspirin EC 81 MG tablet Take 1 tablet (81 mg total) by mouth daily. Swallow whole. 90 tablet 3   Blood Glucose Monitoring Suppl (ACCU-CHEK AVIVA PLUS) w/Device KIT Use to test blood sugars daily.Dx: E11.9 1 kit 3   Continuous Blood Gluc Receiver (FREESTYLE LIBRE 2 READER) DEVI 1 each by Does not apply route daily at 2 PM. Use to check blood sugars. Dx: E11.9 1 each 3   Cyanocobalamin (VITAMIN B-12 PO) Take by mouth.     fluticasone (FLONASE) 50 MCG/ACT nasal spray Place into both nostrils as needed.      glucose blood (ACCU-CHEK AVIVA  PLUS) test strip Use to test blood sugars daily. Dx: E11.9 100 each 3   ibuprofen (ADVIL) 200 MG tablet Take 400 mg by mouth every 6 (six) hours as needed for headache or mild pain.     metFORMIN (GLUCOPHAGE) 1000 MG tablet TAKE 1 TABLET (1,000 MG TOTAL) BY MOUTH 2 (TWO) TIMES DAILY WITH A MEAL. 180 tablet 3   mupirocin ointment (BACTROBAN) 2 % Apply 1 application topically 2 (two) times daily.     Omeprazole Magnesium (PRILOSEC OTC PO) Take 20 mg by mouth as needed.     rosuvastatin (CRESTOR) 40 MG tablet Take 1 tablet (40 mg total) by mouth daily. 90 tablet 3   No current facility-administered medications for this visit.     Objective:  BP 138/84   Pulse 87   Temp 98.7 F (37.1 C)   Ht 6\' 1"  (1.854 m)   Wt 253 lb 3.2 oz (114.9 kg)   SpO2 96%   BMI 33.41 kg/m  Gen: NAD, resting comfortably CV: RRR no murmurs rubs or gallops Lungs: CTAB no crackles, wheeze, rhonchi Ext: minimal edema Skin: warm, dry    Assessment and Plan   # Diabetes-peak A1c 10.7 in June 2022 S: Medication:Metformin 1000 mg twice daily- no side effects still - tried jardiance but felt  dizzy and nauseous CBGs- has meter Exercise and diet- walking an hour most days. Has maintained reasonably healthy diet- trying to put good foods in body  Lab Results  Component Value Date   HGBA1C- POC 7.3 (A) 01/10/2022   HGBA1C 6.6 (H) 09/16/2021   HGBA1C 8.1 (A) 06/06/2021  A/P:  significant improvement last visit with aggressive lifestyle changes and metformin.  Updated poc a1c today and up slightly at 7.3- we opted to  continue same meds and continue efforts for healthy lifestyle changes and recheck in 4 months with phlebtomy  #hyperlipidemia- premature CAD history and father #Nonobstructive CAD-follows with Dr. Tomie China S: Medication:Rosuvastatin 40 mg daily ( use to be Atorvastatin 40 mg daily), also takes aspirin -On 08/03/2021-Coronary calcium score of 39.4. This was 96 percentile for age -Low-grade stress test on  09/14/2021 -Echocardiogram for murmur done 09/14/2021-no cause of murmur found but borderline dilation of the ascending aorta at 37 mm was noted- has cardiology follow up in august  - no chest pain recently. No shortness of breath Lab Results  Component Value Date   CHOL 147 06/24/2021   HDL 30.90 (L) 06/24/2021   LDLCALC 98 06/24/2021   LDLDIRECT 47.0 09/16/2021   TRIG 89.0 06/24/2021   CHOLHDL 5 06/24/2021    A/P: nonobstructive CAD on appropriate meds- continue current meds - for hyperlipidemia- LDL at goal under 70   # smoking- 1/2 PPD or less last visit- 6-8 on a good day now but up to 01 - encouraged cessation particularly now with nonobstructive CAD/elevated coronary artery calcium scoring- not ready to quit but wants to work on cutting down   - smoking may contribute to elevated BP - sparing ibuprofen  #Paresthesias of unclear etiology  -See last few notes.  Almost a year of symptoms intermittently -refer neurology 09/16/21 - has visit in august   Recommended follow up: Return in about 4 months (around 05/12/2022) for followup or sooner if needed.Schedule b4 you leave. Future Appointments  Date Time Provider Department Center  03/03/2022  2:50 PM Nita Sickle K, DO [facility] None  03/15/2022  8:00 AM Revankar, Aundra Dubin, MD [facility] None    Lab/Order associations:   ICD-10-CM   1. Diabetes mellitus type 2 in obese (HCC)  E11.69 POCT HgB A1C   E66.9 Hemoglobin A1c    Comprehensive metabolic panel    Microalbumin / creatinine urine ratio    Ambulatory referral to Ophthalmology    2. Coronary artery disease involving native coronary artery of native heart without angina pectoris  I25.10     3. Hyperlipidemia associated with type 2 diabetes mellitus (HCC)  E11.69    E78.5       No orders of the defined types were placed in this encounter.   Return precautions advised.  Tana Conch, MD

## 2022-01-11 LAB — MICROALBUMIN / CREATININE URINE RATIO
Creatinine,U: 129.4 mg/dL
Microalb Creat Ratio: 16.5 mg/g (ref 0.0–30.0)
Microalb, Ur: 21.4 mg/dL — ABNORMAL HIGH (ref 0.0–1.9)

## 2022-01-31 ENCOUNTER — Encounter (INDEPENDENT_AMBULATORY_CARE_PROVIDER_SITE_OTHER): Payer: Self-pay | Admitting: Ophthalmology

## 2022-01-31 ENCOUNTER — Ambulatory Visit (INDEPENDENT_AMBULATORY_CARE_PROVIDER_SITE_OTHER): Payer: BC Managed Care – PPO | Admitting: Ophthalmology

## 2022-01-31 DIAGNOSIS — E1165 Type 2 diabetes mellitus with hyperglycemia: Secondary | ICD-10-CM

## 2022-01-31 DIAGNOSIS — E119 Type 2 diabetes mellitus without complications: Secondary | ICD-10-CM

## 2022-01-31 DIAGNOSIS — H43823 Vitreomacular adhesion, bilateral: Secondary | ICD-10-CM

## 2022-01-31 HISTORY — DX: Vitreomacular adhesion, bilateral: H43.823

## 2022-01-31 LAB — HM DIABETES EYE EXAM

## 2022-01-31 NOTE — Patient Instructions (Signed)
The nature of diabetic retinopathy was explained using the following analogy: "Retinopathy develops in the body's blood supply like salty water corrodes the linings of pipes in a house, until rust appears, then holes in the pipes develop which leak followed by destruction and loss of the pipes as the corrosion turns them to dust. In a similar fashion, Diabetes damages the blood supply of the body by cumulative long--term elevated blood sugar, which corrodes the blood supply in the body, particularly the blood vessels supplying the retina, kidneys, and nerves".  Thus, control of blood sugar, slows the progression of the corrosive effect of diabetes mellitus.

## 2022-01-31 NOTE — Assessment & Plan Note (Addendum)
The nature of diabetic retinopathy was explained using the following analogy: "Retinopathy develops in the body's blood supply like salty water corrodes the linings of pipes in a house, until rust appears, then holes in the pipes develop which leak followed by destruction and loss of the pipes as the corrosion turns them to dust. In a similar fashion, Diabetes damages the blood supply of the body by cumulative long--term elevated blood sugar, which corrodes the blood supply in the body, particularly the blood vessels supplying the retina, kidneys, and nerves".  Thus, control of blood sugar, slows the progression of the corrosive effect of diabetes mellitus.   Now better controlled.

## 2022-01-31 NOTE — Progress Notes (Signed)
01/31/2022     CHIEF COMPLAINT Patient presents for  Chief Complaint  Patient presents with   Retina Evaluation      HISTORY OF PRESENT ILLNESS: Jack Hall. is a 51 y.o. male who presents to the clinic today for:   HPI     Retina Evaluation           Laterality: both eyes   Associated Symptoms: Negative for Flashes, Floaters, Distortion, Blind Spot, Pain, Redness, Photophobia, Glare, Trauma, Scalp Tenderness, Jaw Claudication, Shoulder/Hip pain, Fever, Weight Loss and Fatigue         Comments   NP- Diabetic eye exam - Ref by PCP, Notes Reviewed Pt stated, "Late evenings after looking at the computer and phone all day, my eyes just feel tired. I have no concerns, my PCP just wanted me to get a check up and baseline on how my eyes are doing." Pt stated vision is stable and expresses no concern other than tiredness.      Last edited by Silvestre Moment on 01/31/2022 10:01 AM.      Referring physician: Marin Olp, [address] [place],  [place] [address]  HISTORICAL INFORMATION:   Selected notes from the MEDICAL RECORD NUMBER    Lab Results  Component Value Date   HGBA1C 7.3 (A) 01/10/2022     CURRENT MEDICATIONS: No current outpatient medications on file. (Ophthalmic Drugs)   No current facility-administered medications for this visit. (Ophthalmic Drugs)   Current Outpatient Medications (Other)  Medication Sig   Accu-Chek Softclix Lancets lancets Use to test blood sugars daily. Dx: E11.9   aspirin EC 81 MG tablet Take 1 tablet (81 mg total) by mouth daily. Swallow whole.   Blood Glucose Monitoring Suppl (ACCU-CHEK AVIVA PLUS) w/Device KIT Use to test blood sugars daily.Dx: E11.9   Continuous Blood Gluc Receiver (FREESTYLE LIBRE 2 READER) DEVI 1 each by Does not apply route daily at 2 PM. Use to check blood sugars. Dx: E11.9   Cyanocobalamin (VITAMIN B-12 PO) Take by mouth.   fluticasone (FLONASE) 50 MCG/ACT nasal spray Place into both  nostrils as needed.    glucose blood (ACCU-CHEK AVIVA PLUS) test strip Use to test blood sugars daily. Dx: E11.9   ibuprofen (ADVIL) 200 MG tablet Take 400 mg by mouth every 6 (six) hours as needed for headache or mild pain.   metFORMIN (GLUCOPHAGE) 1000 MG tablet TAKE 1 TABLET (1,000 MG TOTAL) BY MOUTH 2 (TWO) TIMES DAILY WITH A MEAL.   mupirocin ointment (BACTROBAN) 2 % Apply 1 application topically 2 (two) times daily.   Omeprazole Magnesium (PRILOSEC OTC PO) Take 20 mg by mouth as needed.   rosuvastatin (CRESTOR) 40 MG tablet Take 1 tablet (40 mg total) by mouth daily.   No current facility-administered medications for this visit. (Other)      REVIEW OF SYSTEMS: ROS   Negative for: Constitutional, Gastrointestinal, Neurological, Skin, Genitourinary, Musculoskeletal, HENT, Endocrine, Cardiovascular, Eyes, Respiratory, Psychiatric, Allergic/Imm, Heme/Lymph Last edited by Silvestre Moment on 01/31/2022 10:01 AM.       ALLERGIES No Known Allergies  PAST MEDICAL HISTORY Past Medical History:  Diagnosis Date   Allergic rhinitis    otc flonase   Allergy    seasonal   Diabetes mellitus without complication ([place])    Epididymal cyst 05/09/2017   Felt mass in scrotum- saw urgent care "1. No testicular mass or specific findings of torsion or orchitis. 2. Single bilateral epididymal cysts or spermatoceles, 1.2 cm on  the right and 0.5 cm on the left. 3. Small bilateral hydroceles."   Family history of premature CAD 05-Jun-2015   Father died of heart attack at age 50. Saw cardiology 12/16/15 who did stress test and no ischemia noted   Genetic testing 08/01/2021   [facility] Multi-Cancer Panel was Negative. Report date is 07/28/2021.  The Multi-Cancer + RNA Panel offered by [facility] includes sequencing and/or deletion/duplication analysis of the following 84 genes:  AIP*, ALK, APC*, ATM*, AXIN2*, BAP1*, BARD1*, BLM*, BMPR1A*, BRCA1*, BRCA2*, BRIP1*, CASR, CDC73*, CDH1*, CDK4, CDKN1B*, CDKN1C*, CDKN2A, CEBPA,  CHEK2*, CTNNA1*, DICER1*, DIS3L2*, EGFR, EPCAM, FH*, F   GERD (gastroesophageal reflux disease)    prilosec- hiatal hernia   Hiatal hernia    History of colonic polyps 07/13/2021   Hyperlipidemia associated with type 2 diabetes mellitus ([place] [facility]) 01/06/2021   Left groin hernia 11/10/2015   May call for surgical referral in future-    Obesity    Poorly controlled type 2 diabetes mellitus ([place]) 07/07/2020   New diagnosis December 2021   Smoker 2015-06-05   1/2 PPD   Past Surgical History:  Procedure Laterality Date   COLONOSCOPY  09/01/2019   [facility] Plan   WISDOM TOOTH EXTRACTION     general anesthesia    FAMILY HISTORY Family History  Problem Relation Age of Onset   Arthritis Mother    Diabetes Mother        grandmother   CAD Father    Sudden death Father        heart attack 31- constant stress, poor food choices, smoked heavier than patient   Colon polyps Sister        11 sessile serrated polyps.    Cancer Paternal Uncle        unknown type   Kidney cancer Maternal Grandfather    Prostate cancer Paternal Grandfather    Esophageal cancer Neg Hx    Rectal cancer Neg Hx    Stomach cancer Neg Hx     SOCIAL HISTORY Social History   Tobacco Use   Smoking status: Every Day    Packs/day: 0.50    Types: Cigarettes   Smokeless tobacco: Never  Vaping Use   Vaping Use: Never used  Substance Use Topics   Alcohol use: Yes    Alcohol/week: 5.0 - 8.0 standard drinks of alcohol    Types: 2 - 5 Standard drinks or equivalent, 2 Glasses of wine, 1 Shots of liquor per week   Drug use: Not Currently    Types: Marijuana    Comment: rare marijuana         OPHTHALMIC EXAM:  Base Eye Exam     Visual Acuity (ETDRS)       Right Left   Dist [place] 20/40 20/30 -1   Dist ph [place] 20/25 -1 20/20         Tonometry (Tonopen, 10:06 AM)       Right Left   Pressure 11 13         Pupils       Pupils Dark Light Shape React APD   Right PERRL 3 2 Round Brisk None   Left PERRL 3 2 Round  Brisk None         Visual Fields       Left Right    Full Full         Extraocular Movement       Right Left    Full Full  Neuro/Psych     Oriented x3: Yes         Dilation     Both eyes: 1.0% Mydriacyl, 2.5% Phenylephrine @ 10:06 AM           Slit Lamp and Fundus Exam     External Exam       Right Left   External Normal Normal         Slit Lamp Exam       Right Left   Lids/Lashes Normal Normal   Conjunctiva/Sclera White and quiet White and quiet   Cornea Clear Clear   Anterior Chamber Deep and quiet Deep and quiet   Iris Round and reactive Round and reactive   Lens Clear Clear   Anterior Vitreous Normal Normal         Fundus Exam       Right Left   Posterior Vitreous Normal Normal   Disc Normal Normal   C/D Ratio 0.45 0.45   Macula Normal Normal   Vessels Normal Normal   Periphery Normal Normal            IMAGING AND PROCEDURES  Imaging and Procedures for 01/31/22  OCT, Retina - OU - Both Eyes       Right Eye Quality was good. Scan locations included subfoveal. Central Foveal Thickness: 292. Progression has no prior data. Findings include normal foveal contour, vitreomacular adhesion .   Left Eye Quality was good. Scan locations included subfoveal. Central Foveal Thickness: 293. Progression has no prior data. Findings include normal foveal contour, vitreomacular adhesion .   Notes No active maculopathy either eye.     Color Fundus Photography Optos - OU - Both Eyes       Right Eye Progression has no prior data. Disc findings include normal observations. Macula : normal observations. Vessels : normal observations. Periphery : normal observations.   Left Eye Progression has no prior data. Disc findings include normal observations. Macula : normal observations. Vessels : normal observations. Periphery : normal observations.   Notes OU clear media, no detectable diabetic retinopathy              ASSESSMENT/PLAN:  Poorly controlled type 2 diabetes mellitus ([place]) The nature of diabetic retinopathy was explained using the following analogy: "Retinopathy develops in the body's blood supply like salty water corrodes the linings of pipes in a house, until rust appears, then holes in the pipes develop which leak followed by destruction and loss of the pipes as the corrosion turns them to dust. In a similar fashion, Diabetes damages the blood supply of the body by cumulative long--term elevated blood sugar, which corrodes the blood supply in the body, particularly the blood vessels supplying the retina, kidneys, and nerves".  Thus, control of blood sugar, slows the progression of the corrosive effect of diabetes mellitus.   Now better controlled.  Vitreomacular adhesion of both eyes Physiologic condition normal for aging no pathology observed  Diabetes mellitus without complication ([place]) [id number] no detectable diabetic retinopathy with enhanced blood sugar control present     ICD-10-CM   1. Diabetes mellitus without complication ([place])  P95.0 Color Fundus Photography Optos - OU - Both Eyes    2. Vitreomacular adhesion of both eyes  H43.823 OCT, Retina - OU - Both Eyes    Color Fundus Photography Optos - OU - Both Eyes    3. Poorly controlled type 2 diabetes mellitus ([facility])  E11.65       1.  No detectable diabetic  retinopathy.  2.  Importance of good blood sugar control reviewed in detail with analogies to explain the nature of damage that it can accumulate over the years of diabetes and is particularly uncontrolled  3.  Ophthalmic Meds Ordered this visit:  No orders of the defined types were placed in this encounter.      Return in about 1 year (around 02/01/2023) for DILATE OU, OCT, COLOR FP.  Patient Instructions  The nature of diabetic retinopathy was explained using the following analogy: "Retinopathy develops in the body's blood supply like salty water corrodes the  linings of pipes in a house, until rust appears, then holes in the pipes develop which leak followed by destruction and loss of the pipes as the corrosion turns them to dust. In a similar fashion, Diabetes damages the blood supply of the body by cumulative long--term elevated blood sugar, which corrodes the blood supply in the body, particularly the blood vessels supplying the retina, kidneys, and nerves".  Thus, control of blood sugar, slows the progression of the corrosive effect of diabetes mellitus.      Explained the diagnoses, plan, and follow up with the patient and they expressed understanding.  Patient expressed understanding of the importance of proper follow up care.   Clent Demark Glorya Bartley M.D. Diseases & Surgery of the Retina and Vitreous Retina & Diabetic [place] 01/31/22     Abbreviations: M myopia (nearsighted); A astigmatism; H hyperopia (farsighted); P presbyopia; Mrx spectacle prescription;  CTL contact lenses; OD right eye; OS left eye; OU both eyes  XT exotropia; ET esotropia; PEK punctate epithelial keratitis; PEE punctate epithelial erosions; DES dry eye syndrome; MGD meibomian gland dysfunction; ATs artificial tears; PFAT's preservative free artificial tears; [place] nuclear sclerotic cataract; PSC posterior subcapsular cataract; ERM epi-retinal membrane; PVD posterior vitreous detachment; RD retinal detachment; DM diabetes mellitus; DR diabetic retinopathy; NPDR non-proliferative diabetic retinopathy; PDR proliferative diabetic retinopathy; CSME clinically significant macular edema; DME diabetic macular edema; dbh dot blot hemorrhages; CWS cotton wool spot; POAG primary open angle glaucoma; C/D cup-to-disc ratio; HVF humphrey visual field; GVF goldmann visual field; OCT optical coherence tomography; IOP intraocular pressure; BRVO Branch retinal vein occlusion; CRVO central retinal vein occlusion; CRAO central retinal artery occlusion; BRAO branch retinal artery occlusion; RT retinal  tear; SB scleral buckle; PPV pars plana vitrectomy; VH Vitreous hemorrhage; PRP panretinal laser photocoagulation; IVK intravitreal kenalog; VMT vitreomacular traction; MH Macular hole;  NVD neovascularization of the disc; NVE neovascularization elsewhere; AREDS age related eye disease study; ARMD age related macular degeneration; POAG primary open angle glaucoma; EBMD epithelial/anterior basement membrane dystrophy; ACIOL anterior chamber intraocular lens; IOL intraocular lens; PCIOL posterior chamber intraocular lens; Phaco/IOL phacoemulsification with intraocular lens placement; [place] photorefractive keratectomy; LASIK laser assisted in situ keratomileusis; HTN hypertension; DM diabetes mellitus; COPD chronic obstructive pulmonary disease

## 2022-01-31 NOTE — Assessment & Plan Note (Signed)
01-31-2022 no detectable diabetic retinopathy with enhanced blood sugar control present

## 2022-01-31 NOTE — Assessment & Plan Note (Signed)
Physiologic condition normal for aging no pathology observed

## 2022-03-03 ENCOUNTER — Ambulatory Visit (INDEPENDENT_AMBULATORY_CARE_PROVIDER_SITE_OTHER): Payer: BC Managed Care – PPO | Admitting: Neurology

## 2022-03-03 ENCOUNTER — Encounter: Payer: Self-pay | Admitting: Neurology

## 2022-03-03 VITALS — BP 166/102 | HR 94 | Ht 73.0 in | Wt 259.0 lb

## 2022-03-03 DIAGNOSIS — R208 Other disturbances of skin sensation: Secondary | ICD-10-CM | POA: Diagnosis not present

## 2022-03-03 NOTE — Progress Notes (Signed)
[facility] Clinic Note - Initial Visit   Date: 03/03/2022   Jack Flott. MRN: [id number] DOB: 09/05/1970   Dear Dr. Yong Channel:  Thank you for your kind referral of Jack Hall. for consultation of disturbance of skin sensation. Although his history is well known to you, please allow [place] to reiterate it for the purpose of our medical record. The patient was accompanied to the clinic by self.    Jack Stelly. is a 51 y.o. right-handed male with diabetes mellitus, hyperlipidemia, and GERD presenting for evaluation of left arm sensory change.   IMPRESSION/PLAN: Left arm dysesthesias described as a cold sensation over the dorsal forearm and hand, intermittent.  He denies numbness, tingling, or shooting pain and reports more of an altered sense/discomfort.  His exam is entirely normal.  Based on history and exam, my overall suspicion for neurological etiology is very low.   To be sure we are not missing any peripheral nerve pathology, I will order NCS/EMG of the left arm.  Patient was reassured that I did not see anything worrisome to cause this.   Further recommendations pending results.  ------------------------------------------------------------- History of present illness: In November 2021, he had numbness over the left side of the face and right arm, especially noticeable with temperature differences. He went to the ER where CT head negative.  Her face symptoms improved within a month.  However, the left arm symptoms gradually became less intense over the past 1.5 year. The left forearm has a sensation as if there "a breeze blowing or cold wind" on the dorsal forearm arm and dorsum of the hand. He has these symptoms about once per week.  There is no numbness/tingling or weakness.  He does not have similar symptoms in the right hand or legs.    Out-side paper records, electronic medical record, and images have been reviewed where  available and summarized as:  Lab Results  Component Value Date   HGBA1C 7.3 (A) 01/10/2022   Lab Results  Component Value Date   [id number] 07/06/2020   Lab Results  Component Value Date   TSH 1.38 07/06/2020    Past Medical History:  Diagnosis Date   Allergic rhinitis    otc flonase   Allergy    seasonal   Diabetes mellitus without complication ([place])    Epididymal cyst 05/09/2017   Felt mass in scrotum- saw urgent care "1. No testicular mass or specific findings of torsion or orchitis. 2. Single bilateral epididymal cysts or spermatoceles, 1.2 cm on the right and 0.5 cm on the left. 3. Small bilateral hydroceles."   Family history of premature CAD 2015-05-20   Father died of heart attack at age 26. Saw cardiology 12/16/15 who did stress test and no ischemia noted   Genetic testing 08/01/2021   [facility] Multi-Cancer Panel was Negative. Report date is 07/28/2021.  The Multi-Cancer + RNA Panel offered by [facility] includes sequencing and/or deletion/duplication analysis of the following 84 genes:  AIP*, ALK, APC*, ATM*, AXIN2*, BAP1*, BARD1*, BLM*, BMPR1A*, BRCA1*, BRCA2*, BRIP1*, CASR, CDC73*, CDH1*, CDK4, CDKN1B*, CDKN1C*, CDKN2A, CEBPA, CHEK2*, CTNNA1*, DICER1*, DIS3L2*, EGFR, EPCAM, FH*, F   GERD (gastroesophageal reflux disease)    prilosec- hiatal hernia   Hiatal hernia    History of colonic polyps 07/13/2021   Hyperlipidemia associated with type 2 diabetes mellitus ([place]) 01/06/2021   Left groin hernia 11/10/2015   May call for surgical referral in future-    Obesity  Poorly controlled type 2 diabetes mellitus ([place]) 07/07/2020   New diagnosis December 2021   Smoker 05/12/2015   1/2 PPD    Past Surgical History:  Procedure Laterality Date   COLONOSCOPY  09/01/2019   [facility] Plan   WISDOM TOOTH EXTRACTION     general anesthesia     Medications:  Outpatient Encounter Medications as of 03/03/2022  Medication Sig   Accu-Chek Softclix Lancets lancets Use to test blood sugars  daily. Dx: E11.9   aspirin EC 81 MG tablet Take 1 tablet (81 mg total) by mouth daily. Swallow whole.   Blood Glucose Monitoring Suppl (ACCU-CHEK AVIVA PLUS) w/Device KIT Use to test blood sugars daily.Dx: E11.9   Continuous Blood Gluc Receiver (FREESTYLE LIBRE 2 READER) DEVI 1 each by Does not apply route daily at 2 PM. Use to check blood sugars. Dx: E11.9   Cyanocobalamin (VITAMIN B-12 PO) Take by mouth.   fluticasone (FLONASE) 50 MCG/ACT nasal spray Place into both nostrils as needed.    glucose blood (ACCU-CHEK AVIVA PLUS) test strip Use to test blood sugars daily. Dx: E11.9   ibuprofen (ADVIL) 200 MG tablet Take 400 mg by mouth every 6 (six) hours as needed for headache or mild pain.   metFORMIN (GLUCOPHAGE) 1000 MG tablet TAKE 1 TABLET (1,000 MG TOTAL) BY MOUTH 2 (TWO) TIMES DAILY WITH A MEAL.   mupirocin ointment (BACTROBAN) 2 % Apply 1 application topically 2 (two) times daily.   Omeprazole Magnesium (PRILOSEC OTC PO) Take 20 mg by mouth as needed.   rosuvastatin (CRESTOR) 40 MG tablet Take 1 tablet (40 mg total) by mouth daily.   No facility-administered encounter medications on file as of 03/03/2022.    Allergies: No Known Allergies  Family History: Family History  Problem Relation Age of Onset   Arthritis Mother    Diabetes Mother        grandmother   CAD Father    Sudden death Father        heart attack 26- constant stress, poor food choices, smoked heavier than patient   Colon polyps Sister        11 sessile serrated polyps.    Cancer Paternal Uncle        unknown type   Kidney cancer Maternal Grandfather    Prostate cancer Paternal Grandfather    Esophageal cancer Neg Hx    Rectal cancer Neg Hx    Stomach cancer Neg Hx     Social History: Social History   Tobacco Use   Smoking status: Every Day    Packs/day: 0.50    Types: Cigarettes   Smokeless tobacco: Never   Tobacco comments:    Smokes 8-10 cigarettes a day   Vaping Use   Vaping Use: Never used   Substance Use Topics   Alcohol use: Yes    Alcohol/week: 5.0 - 8.0 standard drinks of alcohol    Types: 2 Glasses of wine, 1 Shots of liquor, 2 - 5 Standard drinks or equivalent per week    Comment: Social Drinking   Drug use: Not Currently    Types: Marijuana    Comment: rare marijuana - once a year   Social History   Social History Narrative   Family: Single- not dating lately. Living alone now. 2020- got new poodle just before Covid 19.       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 No college  Hobbies: time at gym usually 4-5 days a week, friends and family, vacation      Right Handed    Lives in a one story home     Vital Signs:  BP (!) 166/102   Pulse 94   Ht 6' 1"  (1.854 m)   Wt 259 lb (117.5 kg)   SpO2 96%   BMI 34.17 kg/m   Neurological Exam: MENTAL STATUS including orientation to time, place, person, recent and remote memory, attention span and concentration, language, and fund of knowledge is normal.  Speech is not dysarthric.  CRANIAL NERVES: II:  No visual field defects.  III-IV-VI: Pupils equal round and reactive to light.  Normal conjugate, extra-ocular eye movements in all directions of gaze.  No nystagmus.  No ptosis.   V:  Normal facial sensation.    VII:  Normal facial symmetry and movements.   VIII:  Normal hearing and vestibular function.   IX-X:  Normal palatal movement.   XI:  Normal shoulder shrug and head rotation.   XII:  Normal tongue strength and range of motion, no deviation or fasciculation.  MOTOR:  No atrophy, fasciculations or abnormal movements.  No pronator drift.   Upper Extremity:  Right  Left  Deltoid  5/5   5/5   Biceps  5/5   5/5   Triceps  5/5   5/5   Wrist extensors  5/5   5/5   Wrist flexors  5/5   5/5   Finger extensors  5/5   5/5   Finger flexors  5/5   5/5   Dorsal interossei  5/5   5/5   Abductor pollicis  5/5   5/5   Tone  (Ashworth scale)  0  0   Lower Extremity:  Right  Left  Hip flexors  5/5   5/5   Knee extensors  5/5   5/5   Dorsiflexors  5/5   5/5   Plantarflexors  5/5   5/5   Toe extensors  5/5   5/5   Toe flexors  5/5   5/5   Tone (Ashworth scale)  0  0   MSRs:  Right        Left                  brachioradialis 2+  2+  biceps 2+  2+  triceps 2+  2+  patellar 2+  2+  ankle jerk 2+  2+  Hoffman no  no  plantar response down  down   SENSORY:  Normal and symmetric perception of light touch, pinprick, vibration, and proprioception.    COORDINATION/GAIT: Normal finger-to- nose-finger.  Intact rapid alternating movements bilaterally.   Gait narrow based and stable.     Thank you for allowing me to participate in patient's care.  If I can answer any additional questions, I would be pleased to do so.    Sincerely,    Johnie Makki K. Posey Pronto, DO

## 2022-03-03 NOTE — Patient Instructions (Signed)
ELECTROMYOGRAM AND NERVE CONDUCTION STUDIES (EMG/NCS) INSTRUCTIONS  How to Prepare The neurologist conducting the EMG will need to know if you have certain medical conditions. Tell the neurologist and other EMG lab personnel if you: . Have a pacemaker or any other electrical medical device . Take blood-thinning medications . Have hemophilia, a blood-clotting disorder that causes prolonged bleeding Bathing Take a shower or bath shortly before your exam in order to remove oils from your skin. Don't apply lotions or creams before the exam.  What to Expect You'll likely be asked to change into a hospital gown for the procedure and lie down on an examination table. The following explanations can help you understand what will happen during the exam.  . Electrodes. The neurologist or a technician places surface electrodes at various locations on your skin depending on where you're experiencing symptoms. Or the neurologist may insert needle electrodes at different sites depending on your symptoms.  . Sensations. The electrodes will at times transmit a tiny electrical current that you may feel as a twinge or spasm. The needle electrode may cause discomfort or pain that usually ends shortly after the needle is removed. If you are concerned about discomfort or pain, you may want to talk to the neurologist about taking a short break during the exam.  . Instructions. During the needle EMG, the neurologist will assess whether there is any spontaneous electrical activity when the muscle is at rest - activity that isn't present in healthy muscle tissue - and the degree of activity when you slightly contract the muscle.  He or she will give you instructions on resting and contracting a muscle at appropriate times. Depending on what muscles and nerves the neurologist is examining, he or she may ask you to change positions during the exam.  After your EMG You may experience some temporary, minor bruising where the  needle electrode was inserted into your muscle. This bruising should fade within several days. If it persists, contact your primary care doctor.    

## 2022-03-14 ENCOUNTER — Other Ambulatory Visit: Payer: Self-pay

## 2022-03-15 ENCOUNTER — Encounter: Payer: Self-pay | Admitting: Cardiology

## 2022-03-15 ENCOUNTER — Ambulatory Visit: Payer: BC Managed Care – PPO | Attending: Cardiology | Admitting: Cardiology

## 2022-03-15 VITALS — BP 153/95 | HR 92 | Ht 73.0 in | Wt 256.0 lb

## 2022-03-15 DIAGNOSIS — E1169 Type 2 diabetes mellitus with other specified complication: Secondary | ICD-10-CM | POA: Diagnosis not present

## 2022-03-15 DIAGNOSIS — I2510 Atherosclerotic heart disease of native coronary artery without angina pectoris: Secondary | ICD-10-CM

## 2022-03-15 DIAGNOSIS — R931 Abnormal findings on diagnostic imaging of heart and coronary circulation: Secondary | ICD-10-CM | POA: Diagnosis not present

## 2022-03-15 DIAGNOSIS — F17210 Nicotine dependence, cigarettes, uncomplicated: Secondary | ICD-10-CM

## 2022-03-15 DIAGNOSIS — E785 Hyperlipidemia, unspecified: Secondary | ICD-10-CM

## 2022-03-15 DIAGNOSIS — E119 Type 2 diabetes mellitus without complications: Secondary | ICD-10-CM

## 2022-03-15 DIAGNOSIS — F17200 Nicotine dependence, unspecified, uncomplicated: Secondary | ICD-10-CM

## 2022-03-15 NOTE — Progress Notes (Signed)
Cardiology Office Note:    Date:  03/15/2022   ID:  Jack Hall., DOB 07-10-1971, MRN [id number]  PCP:  Marin Olp, MD  Cardiologist:  Jenean Lindau, MD   Referring MD: Marin Olp, MD    ASSESSMENT:    1. Elevated coronary artery calcium score   2. Hyperlipidemia associated with type 2 diabetes mellitus ([place])   3. Coronary artery disease involving native coronary artery of native heart without angina pectoris   4. Diabetes mellitus without complication (HCC)   5. Class 2 severe obesity due to excess calories with serious comorbidity in adult, unspecified BMI ([place])   6. Smoker    PLAN:    In order of problems listed above:  Coronary artery disease and elevated calcium score: Secondary prevention stressed to the patient.  Importance of compliance with diet medication stressed any vocalized understanding he was advised to walk at least half an hour a day 5 days a week and he promises to do so. Mixed dyslipidemia: Lipids were reviewed and they are fine and he is going to get them rechecked in the next few days.  He will forward [place] a copy of the report. Diabetes mellitus: Not well controlled.  He is doing his best with diet.  He was a little lax because of his work situation and he tells me that he is a stress eater. Cigarette smoker: I spent 5 minutes with the patient discussing solely about smoking. Smoking cessation was counseled. I suggested to the patient also different medications and pharmacological interventions. Patient is keen to try stopping on its own at this time. He will get back to me if he needs any further assistance in this matter. Patient will be seen in follow-up appointment in 12 months or earlier if the patient has any concerns    Medication Adjustments/Labs and Tests Ordered: Current medicines are reviewed at length with the patient today.  Concerns regarding medicines are outlined above.  No orders of the defined types were placed in  this encounter.  No orders of the defined types were placed in this encounter.    No chief complaint on file.    History of Present Illness:    Jack Rosander. is a 51 y.o. male.  Patient has past medical history of mixed dyslipidemia, diabetes mellitus, whitecoat hypertension and obesity.  Unfortunately continues to smoke.  He is living a sedentary lifestyle because he mentions to me that he is busy at work.  At the time of my evaluation, the patient is alert awake oriented and in no distress.  Past Medical History:  Diagnosis Date   Allergic rhinitis    otc flonase   Allergy    seasonal   Diabetes mellitus without complication (HCC)    Elevated coronary artery calcium score 08/26/2021   Epididymal cyst 05/09/2017   Felt mass in scrotum- saw urgent care "1. No testicular mass or specific findings of torsion or orchitis. 2. Single bilateral epididymal cysts or spermatoceles, 1.2 cm on the right and 0.5 cm on the left. 3. Small bilateral hydroceles."   Family history of premature CAD 2015/05/21   Father died of heart attack at age 35. Saw cardiology 12/16/15 who did stress test and no ischemia noted   Genetic testing 08/01/2021   [facility] Multi-Cancer Panel was Negative. Report date is 07/28/2021.  The Multi-Cancer + RNA Panel offered by [facility] includes sequencing and/or deletion/duplication analysis of the following 84 genes:  AIP*, ALK, APC*, ATM*,  AXIN2*, BAP1*, BARD1*, BLM*, BMPR1A*, BRCA1*, BRCA2*, BRIP1*, CASR, CDC73*, CDH1*, CDK4, CDKN1B*, CDKN1C*, CDKN2A, CEBPA, CHEK2*, CTNNA1*, DICER1*, DIS3L2*, EGFR, EPCAM, FH*, F   GERD (gastroesophageal reflux disease)    prilosec- hiatal hernia   Hiatal hernia    History of colonic polyps 07/13/2021   Hyperlipidemia associated with type 2 diabetes mellitus ([place]) 01/06/2021   Left groin hernia 11/10/2015   May call for surgical referral in future-    Nonobstructive CAD 09/16/2021   -On 08/03/2021-Coronary calcium score of 39.4. This was  103 percentile for age   Obesity    Poorly controlled type 2 diabetes mellitus ([place]) 07/07/2020   New diagnosis December 2021   Smoker 05/12/2015   1/2 PPD   Vitreomacular adhesion of both eyes 01/31/2022    Past Surgical History:  Procedure Laterality Date   COLONOSCOPY  09/01/2019   [facility] [facility]   WISDOM TOOTH EXTRACTION     general anesthesia    Current Medications: Current Meds  Medication Sig   Accu-Chek Softclix Lancets lancets Use to test blood sugars daily. Dx: E11.9   aspirin EC 81 MG tablet Take 1 tablet (81 mg total) by mouth daily. Swallow whole.   Blood Glucose Monitoring Suppl (ACCU-CHEK AVIVA PLUS) w/Device KIT Use to test blood sugars daily.Dx: E11.9   Continuous Blood Gluc Receiver (FREESTYLE LIBRE 2 READER) DEVI 1 each by Does not apply route daily at 2 PM. Use to check blood sugars. Dx: E11.9   Cyanocobalamin (VITAMIN B-12 PO) Take 1 tablet by mouth daily.   fluticasone (FLONASE) 50 MCG/ACT nasal spray Place 1-2 sprays into both nostrils as needed for rhinitis or allergies.   glucose blood (ACCU-CHEK AVIVA PLUS) test strip Use to test blood sugars daily. Dx: E11.9   ibuprofen (ADVIL) 200 MG tablet Take 400 mg by mouth every 6 (six) hours as needed for headache or mild pain.   metFORMIN (GLUCOPHAGE) 1000 MG tablet TAKE 1 TABLET (1,000 MG TOTAL) BY MOUTH 2 (TWO) TIMES DAILY WITH A MEAL.   mupirocin ointment (BACTROBAN) 2 % Apply 1 application topically 2 (two) times daily.   Omeprazole Magnesium (PRILOSEC OTC PO) Take 20 mg by mouth as needed for indigestion.   rosuvastatin (CRESTOR) 40 MG tablet Take 1 tablet (40 mg total) by mouth daily.     Allergies:   Patient has no known allergies.   Social History   Socioeconomic History   Marital status: Single    Spouse name: Not on file   Number of children: Not on file   Years of education: Not on file   Highest education level: Not on file  Occupational History   Not on file  Tobacco Use   Smoking status: Every Day     Packs/day: 0.50    Types: Cigarettes   Smokeless tobacco: Never   Tobacco comments:    Smokes 8-10 cigarettes a day   Vaping Use   Vaping Use: Never used  Substance and Sexual Activity   Alcohol use: Yes    Alcohol/week: 5.0 - 8.0 standard drinks of alcohol    Types: 2 Glasses of wine, 1 Shots of liquor, 2 - 5 Standard drinks or equivalent per week    Comment: Social Drinking   Drug use: Not Currently    Types: Marijuana    Comment: rare marijuana - once a year   Sexual activity: Not on file  Other Topics Concern   Not on file  Social History Narrative   Family: Single- not dating lately. Living alone  now. 2020- got new poodle just before Covid 19.       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 No college      Hobbies: time at gym usually 4-5 days a week, friends and family, vacation      Right Handed    Lives in a one story home    Social Determinants of Health   Financial Resource Strain: Not on file  Food Insecurity: Not on file  Transportation Needs: Not on file  Physical Activity: Not on file  Stress: Not on file  Social Connections: Not on file     Family History: The patient's family history includes Arthritis in his mother; CAD in his father; Cancer in his paternal uncle; Colon polyps in his sister; Diabetes in his mother; Kidney cancer in his maternal grandfather; Prostate cancer in his paternal grandfather; Sudden death in his father. There is no history of Esophageal cancer, Rectal cancer, or Stomach cancer.  ROS:   Please see the history of present illness.    All other systems reviewed and are negative.  EKGs/Labs/Other Studies Reviewed:    The following studies were reviewed today: I discussed my findings with the patient at length.   Recent Labs: 06/24/2021: Hemoglobin 16.5; Platelets 191.0 09/16/2021: ALT 16; BUN 13; Creatinine, Ser 0.77; Potassium 3.9; Sodium 137   Recent Lipid Panel    Component Value Date/Time   CHOL 147 06/24/2021 0836   TRIG 89.0 06/24/2021 0836   HDL 30.90 (L) 06/24/2021 0836   CHOLHDL 5 06/24/2021 0836   VLDL 17.8 06/24/2021 0836   LDLCALC 98 06/24/2021 0836   LDLCALC 76 07/06/2020 0849   LDLDIRECT 47.0 09/16/2021 1326    Physical Exam:    VS:  BP (!) 153/95   Pulse 92   Ht 6' 1"  (1.854 m)   Wt 256 lb (116.1 kg)   SpO2 97%   BMI 33.78 kg/m     Wt Readings from Last 3 Encounters:  03/15/22 256 lb (116.1 kg)  03/03/22 259 lb (117.5 kg)  01/10/22 253 lb 3.2 oz (114.9 kg)     GEN: Patient is in no acute distress HEENT: Normal NECK: No JVD; No carotid bruits LYMPHATICS: No lymphadenopathy CARDIAC: Hear sounds regular, 2/6 systolic murmur at the apex. RESPIRATORY:  Clear to auscultation without rales, wheezing or rhonchi  ABDOMEN: Soft, non-tender, non-distended MUSCULOSKELETAL:  No edema; No deformity  SKIN: Warm and dry NEUROLOGIC:  Alert and oriented x 3 PSYCHIATRIC:  Normal affect   Signed, Jenean Lindau, MD  03/15/2022 8:30 AM    [place]

## 2022-03-15 NOTE — Patient Instructions (Signed)

## 2022-04-10 ENCOUNTER — Encounter: Payer: Self-pay | Admitting: *Deleted

## 2022-04-11 ENCOUNTER — Encounter: Payer: Self-pay | Admitting: Neurology

## 2022-05-04 ENCOUNTER — Encounter: Payer: BC Managed Care – PPO | Admitting: Neurology

## 2022-05-05 ENCOUNTER — Other Ambulatory Visit (INDEPENDENT_AMBULATORY_CARE_PROVIDER_SITE_OTHER): Payer: BC Managed Care – PPO

## 2022-05-05 DIAGNOSIS — E1169 Type 2 diabetes mellitus with other specified complication: Secondary | ICD-10-CM | POA: Diagnosis not present

## 2022-05-05 DIAGNOSIS — E669 Obesity, unspecified: Secondary | ICD-10-CM

## 2022-05-05 LAB — HEMOGLOBIN A1C: Hgb A1c MFr Bld: 8.7 % — ABNORMAL HIGH (ref 4.6–6.5)

## 2022-05-05 LAB — COMPREHENSIVE METABOLIC PANEL
ALT: 24 U/L (ref 0–53)
AST: 19 U/L (ref 0–37)
Albumin: 4.7 g/dL (ref 3.5–5.2)
Alkaline Phosphatase: 54 U/L (ref 39–117)
BUN: 13 mg/dL (ref 6–23)
CO2: 27 mEq/L (ref 19–32)
Calcium: 9.7 mg/dL (ref 8.4–10.5)
Chloride: 100 mEq/L (ref 96–112)
Creatinine, Ser: 0.77 mg/dL (ref 0.40–1.50)
GFR: 104.54 mL/min (ref >60.00)
Glucose, Bld: 192 mg/dL — ABNORMAL HIGH (ref 70–99)
Potassium: 4.3 mEq/L (ref 3.5–5.1)
Sodium: 137 mEq/L (ref 135–145)
Total Bilirubin: 0.7 mg/dL (ref 0.2–1.2)
Total Protein: 7 g/dL (ref 6.0–8.3)

## 2022-05-11 ENCOUNTER — Ambulatory Visit (INDEPENDENT_AMBULATORY_CARE_PROVIDER_SITE_OTHER): Payer: BC Managed Care – PPO | Admitting: Neurology

## 2022-05-11 DIAGNOSIS — R208 Other disturbances of skin sensation: Secondary | ICD-10-CM

## 2022-05-11 NOTE — Progress Notes (Signed)
Follow-up Visit   Date: 05/11/2022    Jack Hall. MRN: [id number] DOB: 1971/02/21    Jack Hall. is a 51 y.o. right-handed Caucasian male with diabetes mellitus, GERD, and hyperlipidemia returning to the clinic for follow-up of left arm parethesias.  The patient was accompanied to the clinic by self.   IMPRESSION/PLAN: Left arm dysesthesias involving the dorsal forearm are nonspecific, no evidence of cervical radiculopathy or radial neuropathy on EMG.  Results of EMG discussed with pt. Fortunately, it has been improving over time.  Left carpal tunnel syndrome and left ulnar neuropathy is seen on EMG, however he denies paresthesias of the hands.  I suggested that he monitor for symptoms and if he develops numbness/tingling of the hands, then consider using a wrist brace.  Also suggested to avoid leaning elbow on surfaces or over bending at the elbow.   Return to clinic as needed  --------------------------------------------- History of present illness: In November 2021, he had numbness over the left side of the face and right arm, especially noticeable with temperature differences. He went to the ER where CT head negative.  Her face symptoms improved within a month.  However, the left arm symptoms gradually became less intense over the past 1.5 year. The left forearm has a sensation as if there "a breeze blowing or cold wind" on the dorsal forearm arm and dorsum of the hand. He has these symptoms about once per week.  There is no numbness/tingling or weakness.  He does not have similar symptoms in the right hand or legs.   UPDATE 05/11/2022:  He is here for EMG of the left arm.  He continues to have episodic cold sensation over the left forearm.  No numbness/tingling or weakness of the hand.   Medications:  Current Outpatient Medications on File Prior to Visit  Medication Sig Dispense Refill   Accu-Chek Softclix Lancets lancets Use to test blood sugars  daily. Dx: E11.9 100 each 3   aspirin EC 81 MG tablet Take 1 tablet (81 mg total) by mouth daily. Swallow whole. 90 tablet 3   Blood Glucose Monitoring Suppl (ACCU-CHEK AVIVA PLUS) w/Device KIT Use to test blood sugars daily.Dx: E11.9 1 kit 3   Continuous Blood Gluc Receiver (FREESTYLE LIBRE 2 READER) DEVI 1 each by Does not apply route daily at 2 PM. Use to check blood sugars. Dx: E11.9 1 each 3   Cyanocobalamin (VITAMIN B-12 PO) Take 1 tablet by mouth daily.     fluticasone (FLONASE) 50 MCG/ACT nasal spray Place 1-2 sprays into both nostrils as needed for rhinitis or allergies.     glucose blood (ACCU-CHEK AVIVA PLUS) test strip Use to test blood sugars daily. Dx: E11.9 100 each 3   ibuprofen (ADVIL) 200 MG tablet Take 400 mg by mouth every 6 (six) hours as needed for headache or mild pain.     metFORMIN (GLUCOPHAGE) 1000 MG tablet TAKE 1 TABLET (1,000 MG TOTAL) BY MOUTH 2 (TWO) TIMES DAILY WITH A MEAL. 180 tablet 3   mupirocin ointment (BACTROBAN) 2 % Apply 1 application topically 2 (two) times daily.     Omeprazole Magnesium (PRILOSEC OTC PO) Take 20 mg by mouth as needed for indigestion.     rosuvastatin (CRESTOR) 40 MG tablet Take 1 tablet (40 mg total) by mouth daily. 90 tablet 3   No current facility-administered medications on file prior to visit.    Allergies: No Known Allergies  Vital Signs:  There were no vitals  taken for this visit.    Neurological Exam: Deferred  Data: NCS/EMG of the left arm 05/11/2022: Left median neuropathy at or distal to the wrist, consistent with a clinical diagnosis of carpal tunnel syndrome.  Overall, these findings are moderate in degree electrically.   Left ulnar neuropathy with slowing across the elbow, with demyelinating and axonal features, moderate. There is no evidence of a left cervical radiculopathy.   Thank you for allowing me to participate in patient's care.  If I can answer any additional questions, I would be pleased to do so.     Sincerely,    Kamerin Axford K. Posey Pronto, DO

## 2022-05-11 NOTE — Procedures (Signed)
[facility]  [address]  [place], [place] [address] Tel: [phone] Fax: [phone] Test Date:  05/11/2022  Patient: Jack Hall, Jack Hall. DOB: 1970/12/25 Physician: Narda Amber, DO  Sex: Male Height: '6\' 1"'$  Ref Phys: Narda Amber, DO  ID#: [id number]   Technician:    History: This is a 51 year old man referred for evaluation of left arm paresthesias.  NCV & EMG Findings: Extensive electrodiagnostic testing of the left upper extremity shows:  Left median sensory response shows prolonged latency (4.3 ms) and reduced amplitude (11.9 V).  Left ulnar and radial sensory responses are within normal limits. Left median motor response shows prolonged latency (4.8 ms).  Left ulnar motor response shows reduced amplitude at the abductor digit he minimi (6.7 mV) and slowed conduction velocity across the elbow  (A Elbow-B Elbow, 40 m/s).   There is no evidence of active or chronic motor axonal loss changes affecting any of the tested muscles.  Motor unit configuration and recruitment pattern is within normal limits.  Impression: Left median neuropathy at or distal to the wrist, consistent with a clinical diagnosis of carpal tunnel syndrome.  Overall, these findings are moderate in degree electrically.   Left ulnar neuropathy with slowing across the elbow, with demyelinating and axonal features, moderate. There is no evidence of a left cervical radiculopathy.   ___________________________ Narda Amber, DO    Nerve Conduction Studies   Stim Site NR Peak (ms) Norm Peak (ms) O-P Amp (V) Norm O-P Amp  Left Median Anti Sensory (2nd Digit)  32 C  Wrist    *4.3 <3.6 *11.9 >15  Left Radial Anti Sensory (Base 1st Digit)  32 C  Wrist    2.3 <2.7 19.2 >14  Left Ulnar Anti Sensory (5th Digit)  32 C  Wrist    2.8 <3.1 20.8 >10     Stim Site NR Onset (ms) Norm Onset (ms) O-P Amp (mV) Norm O-P Amp Site1 Site2 Delta-0 (ms) Dist (cm) Vel (m/s) Norm Vel (m/s)  Left  Median Motor (Abd Poll Brev)  32 C  Wrist    *4.8 <4.0 7.2 >6 Elbow Wrist 5.7 32.0 56 >50  Elbow    10.5  6.7         Left Ulnar Motor (Abd Dig Minimi)  32 C  Wrist    2.9 <3.1 *6.7 >7 B Elbow Wrist 4.0 24.0 60 >50  B Elbow    6.9  6.3  A Elbow B Elbow 2.5 10.0 *40 >50  A Elbow    9.4  5.9         Left Ulnar (FDI) Motor (1st DI)  32 C  Wrist    3.5 <4.5 10.0 >7 B Elbow Wrist 4.6 24.0 52 >50  B Elbow    8.1  9.7  A Elbow B Elbow 2.4 10.0 *42 >50  A Elbow    10.5  9.8          Electromyography   Side Muscle Ins.Act Fibs Fasc Recrt Amp Dur Poly Activation Comment  Left 1stDorInt Nml Nml Nml Nml Nml Nml Nml Nml N/A  Left Abd Poll Brev Nml Nml Nml Nml Nml Nml Nml Nml N/A  Left PronatorTeres Nml Nml Nml Nml Nml Nml Nml Nml N/A  Left Biceps Nml Nml Nml Nml Nml Nml Nml Nml N/A  Left Triceps Nml Nml Nml Nml Nml Nml Nml Nml N/A  Left Deltoid Nml Nml Nml Nml Nml Nml Nml Nml N/A  Left Abd Dig  Min Nml Nml Nml Nml Nml Nml Nml Nml N/A  Left FlexCarpiUln Nml Nml Nml Nml Nml Nml Nml Nml N/A      Waveforms:

## 2022-05-12 ENCOUNTER — Encounter: Payer: Self-pay | Admitting: Family Medicine

## 2022-05-12 ENCOUNTER — Ambulatory Visit (INDEPENDENT_AMBULATORY_CARE_PROVIDER_SITE_OTHER): Payer: BC Managed Care – PPO | Admitting: Family Medicine

## 2022-05-12 ENCOUNTER — Telehealth: Payer: Self-pay | Admitting: Family Medicine

## 2022-05-12 VITALS — BP 138/88 | HR 60 | Temp 98.0°F | Ht 73.0 in | Wt 256.4 lb

## 2022-05-12 DIAGNOSIS — E1169 Type 2 diabetes mellitus with other specified complication: Secondary | ICD-10-CM

## 2022-05-12 DIAGNOSIS — E785 Hyperlipidemia, unspecified: Secondary | ICD-10-CM

## 2022-05-12 DIAGNOSIS — Z23 Encounter for immunization: Secondary | ICD-10-CM

## 2022-05-12 DIAGNOSIS — I2510 Atherosclerotic heart disease of native coronary artery without angina pectoris: Secondary | ICD-10-CM | POA: Diagnosis not present

## 2022-05-12 DIAGNOSIS — R931 Abnormal findings on diagnostic imaging of heart and coronary circulation: Secondary | ICD-10-CM | POA: Diagnosis not present

## 2022-05-12 DIAGNOSIS — E1165 Type 2 diabetes mellitus with hyperglycemia: Secondary | ICD-10-CM

## 2022-05-12 DIAGNOSIS — F17200 Nicotine dependence, unspecified, uncomplicated: Secondary | ICD-10-CM

## 2022-05-12 NOTE — Progress Notes (Signed)
Phone [phone] In person visit   Subjective:   Jack Hall. is a 51 y.o. year old very pleasant male patient who presents for/with See problem oriented charting Chief Complaint  Patient presents with   Follow-up    Wants to discuss [facility] and Pneumonia.   Diabetes   Past Medical History-  Patient Active Problem List   Diagnosis Date Noted   Nonobstructive CAD 09/16/2021    Priority: High   Elevated coronary artery calcium score 08/26/2021    Priority: High   Poorly controlled type 2 diabetes mellitus ([place]) 07/07/2020    Priority: High   Smoker 05/12/2015    Priority: High   Family history of premature CAD 05/12/2015    Priority: High   Genetic testing 08/01/2021    Priority: Medium    Hyperlipidemia associated with type 2 diabetes mellitus ([place]) 01/06/2021    Priority: Medium    Left groin hernia 11/10/2015    Priority: Medium    Obesity     Priority: Medium    Allergy 08/22/2021    Priority: Low   Hiatal hernia 08/22/2021    Priority: Low   History of colonic polyps 07/13/2021    Priority: Low   Epididymal cyst 05/09/2017    Priority: Low   Allergic rhinitis     Priority: Low   GERD (gastroesophageal reflux disease)     Priority: Low   Vitreomacular adhesion of both eyes 01/31/2022   Diabetes mellitus without complication ([place]) 72/53/6644    Medications- reviewed and updated Current Outpatient Medications  Medication Sig Dispense Refill   Accu-Chek Softclix Lancets lancets Use to test blood sugars daily. Dx: E11.9 100 each 3   aspirin EC 81 MG tablet Take 1 tablet (81 mg total) by mouth daily. Swallow whole. 90 tablet 3   Blood Glucose Monitoring Suppl (ACCU-CHEK AVIVA PLUS) w/Device KIT Use to test blood sugars daily.Dx: E11.9 1 kit 3   Continuous Blood Gluc Receiver (FREESTYLE LIBRE 2 READER) DEVI 1 each by Does not apply route daily at 2 PM. Use to check blood sugars. Dx: E11.9 1 each 3   Cyanocobalamin (VITAMIN B-12 PO) Take 1 tablet by  mouth daily.     fluticasone (FLONASE) 50 MCG/ACT nasal spray Place 1-2 sprays into both nostrils as needed for rhinitis or allergies.     glucose blood (ACCU-CHEK AVIVA PLUS) test strip Use to test blood sugars daily. Dx: E11.9 100 each 3   ibuprofen (ADVIL) 200 MG tablet Take 400 mg by mouth every 6 (six) hours as needed for headache or mild pain.     metFORMIN (GLUCOPHAGE) 1000 MG tablet TAKE 1 TABLET (1,000 MG TOTAL) BY MOUTH 2 (TWO) TIMES DAILY WITH A MEAL. 180 tablet 3   mupirocin ointment (BACTROBAN) 2 % Apply 1 application topically 2 (two) times daily.     Omeprazole Magnesium (PRILOSEC OTC PO) Take 20 mg by mouth as needed for indigestion.     rosuvastatin (CRESTOR) 40 MG tablet Take 1 tablet (40 mg total) by mouth daily. 90 tablet 3   No current facility-administered medications for this visit.     Objective:  BP (!) 140/80   Pulse 60   Temp 98 F (36.7 C)   Ht _0  (1.854 m)   Wt 256 lb 6.4 oz (116.3 kg)   SpO2 100%   BMI 33.83 kg/m  Gen: NAD, resting comfortably CV: RRR no murmurs rubs or gallops Lungs: CTAB no crackles, wheeze, rhonchi Ext: no edema Skin: warm,  dry  Diabetic Foot Exam - Simple   Simple Foot Form Diabetic Foot exam was performed with the following findings: Yes 05/12/2022 11:04 AM  Visual Inspection No deformities, no ulcerations, no other skin breakdown bilaterally: Yes Sensation Testing Intact to touch and monofilament testing bilaterally: Yes Pulse Check Posterior Tibialis and Dorsalis pulse intact bilaterally: Yes Comments        Assessment and Plan    # Diabetes-peak A1c 10.7 in June 2022 S: Medication:Metformin 1000 mg twice daily- no side effects - tried jardiance but felt dizzy and nauseous -hesitant on ozempic- feels has baseline stomach issues -tough 3-4 months at work and he somewhat expected a1c to go up- more stress and not eating as well and not exercising- was working 7 days a week- finally slowed on labor day- has just  restarted exercise in last 3 weeks- starting to feel better less worn out CBGs- not checking Exercise and diet- up 3 lbs from last visit but feels starting to improve   Lab Results  Component Value Date   HGBA1C 8.7 (H) 05/05/2022   HGBA1C 7.3 (A) 01/10/2022   HGBA1C 6.6 (H) 09/16/2021  A/P: feels like had big set back with work but that has resolved and last 3 weeks has restarted exercise- labs were a week ago so a1c only included 2 weeks of last 13 weeks with these improvements. Also wants to work on changing diet and hold off on medication changes such as adding ozempic - continue current meds for now- despite mild to moderate poor control  #hyperlipidemia- premature CAD history and father #Nonobstructive CAD-follows with Dr. Geraldo Pitter S: Medication:Rosuvastatin 40 mg daily ( use to be Atorvastatin 40 mg daily), was started on aspirin -On 08/03/2021-Coronary calcium score of 39.4. This was 36 percentile for age - no  chest pain or shortness of breath Lab Results  Component Value Date   CHOL 147 06/24/2021   HDL 30.90 (L) 06/24/2021   LDLCALC 98 06/24/2021   LDLDIRECT 47.0 09/16/2021   TRIG 89.0 06/24/2021   CHOLHDL 5 06/24/2021   A/P: CAD nonobstructive asymptomatic- continue current meds.  Lipids drastically improved with LDL at 47- well below goal- continue current meds - plans to schedule 1 year follow up with Dr. Geraldo Pitter as well   # smoking- under 1/2 PPD- feels headed in right direction- wants to continue to cut back - on average over lifetime of 30 years smoking about 0.75 packs per day so 22.5 pack years   #Blood pressure high acceptable range without meds- he prefers to work on lifestyle- recheck at 14 week visit  #Paresthesias of unclear etiology  -See last note.  Almost a year of symptoms intermittently -saw neurology and no major issues on EMG- plan is to follow up only if worsening  Recommended follow up: Return in about 14 weeks (around 08/18/2022) for followup or  sooner if needed.Schedule b4 you leave. Future Appointments  Date Time Provider [place]  09/15/2022  8:00 AM Marin Olp, MD [facility] [place]  02/01/2023 10:45 AM [place], Clent Demark, MD [facility] None   Lab/Order associations:   ICD-10-CM   1. Poorly controlled type 2 diabetes mellitus (HCC)  E11.65     2. Coronary artery disease involving native coronary artery of native heart without angina pectoris  I25.10     3. Elevated coronary artery calcium score  R93.1     4. Hyperlipidemia associated with type 2 diabetes mellitus ([place])  E11.69    E78.5  5. Need for immunization against influenza  Z23 Flu Vaccine QUAD 15moIM (Fluarix, Fluzone & Alfiuria Quad PF)    6. Smoker  F17.200 Ambulatory Referral for Lung Cancer Scre     No orders of the defined types were placed in this encounter.  Return precautions advised.  SGarret Reddish MD

## 2022-05-12 NOTE — Telephone Encounter (Signed)
I went back and ordered these- he should be able to schedule now labs before visit

## 2022-05-12 NOTE — Patient Instructions (Addendum)
Plans to do covid in 2 weeks at drug store- let us know when you get this  In about 4 weeks plans on either prevnar 20- update [place] and perhaps sometime later plans on shingrix  We will call you within two weeks about your referral to lung cancer screening program. If you do not hear within 2 weeks, give [place] a call.   Recommended follow up: Return in about 14 weeks (around 08/18/2022) for followup or sooner if needed.Schedule b4 you leave.

## 2022-05-12 NOTE — Telephone Encounter (Signed)
Ok to schedule lab visit.

## 2022-05-12 NOTE — Telephone Encounter (Signed)
Pt is wanting to schedule his labs before his next appt but there are no active requests. Please advise.

## 2022-05-12 NOTE — Telephone Encounter (Signed)
Which labs are you wanting pt to have done for the next appt?

## 2022-05-12 NOTE — Addendum Note (Signed)
Addended by: Marin Olp on: 05/12/2022 12:54 PM   Modules accepted: Orders

## 2022-05-21 IMAGING — CT CT HEAD W/O CM
3 series · 15 of 47 positions shown, 18 images · non-contrast
Comparison: None.

CLINICAL DATA: Dizziness.

EXAM:
CT HEAD WITHOUT CONTRAST
TECHNIQUE: Contiguous axial images were obtained from the base of the skull
through the vertex without intravenous contrast.

[Series 3: head 5.0 h30s · axial · 0.49mm/px · z∈[-143,+2]mm · 9 of 35 slices shown, 12 images]
[im 3/35  brain]
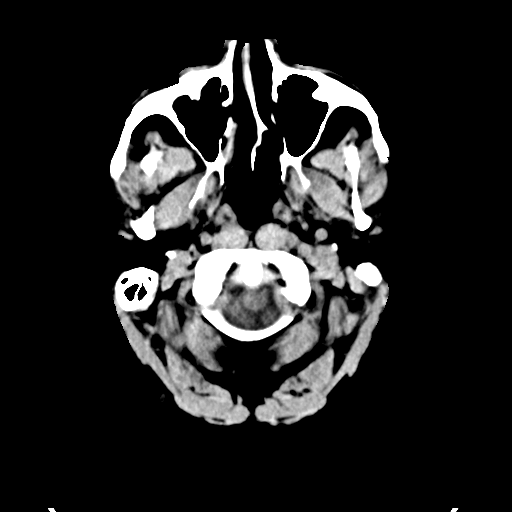
[im 3/35  bone]
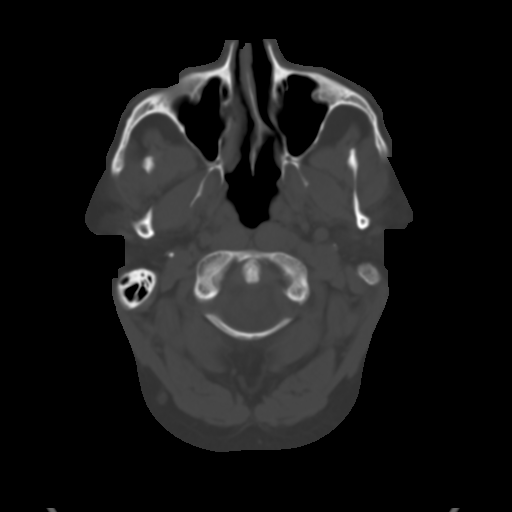
[im 6/35  brain]
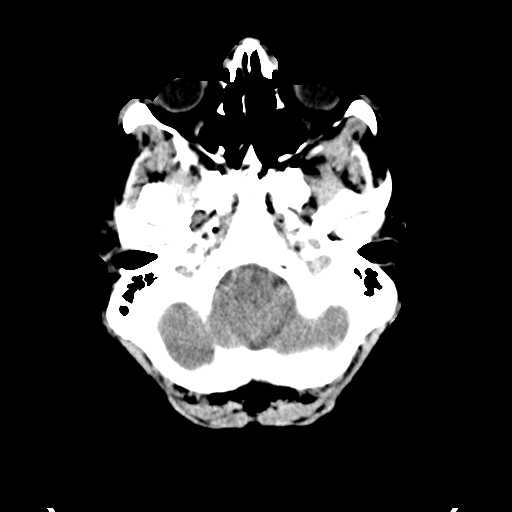
[im 10/35  brain]
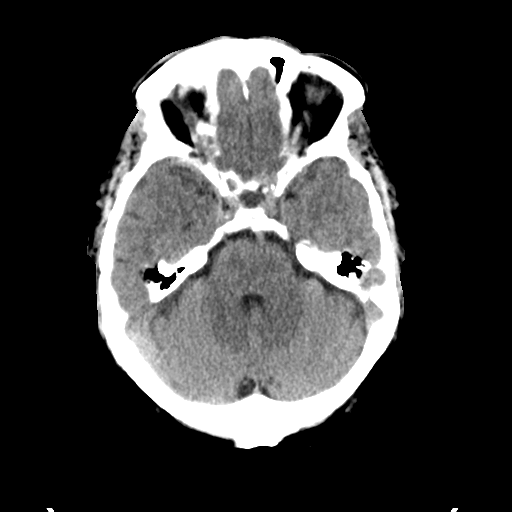
[im 13/35  brain]
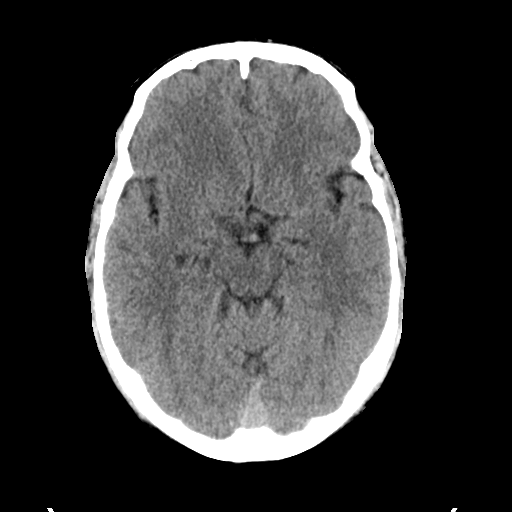
[im 18/35  brain]
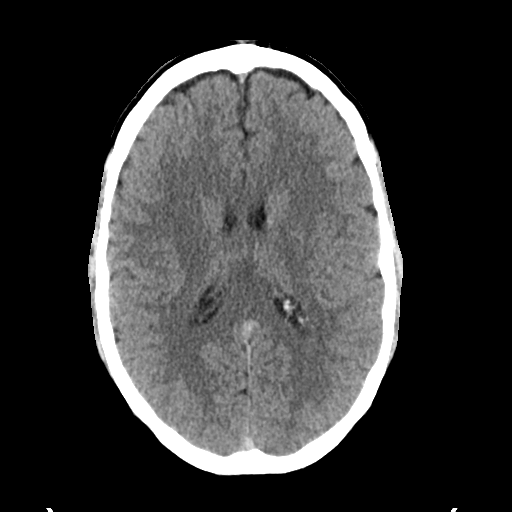
[im 18/35  bone]
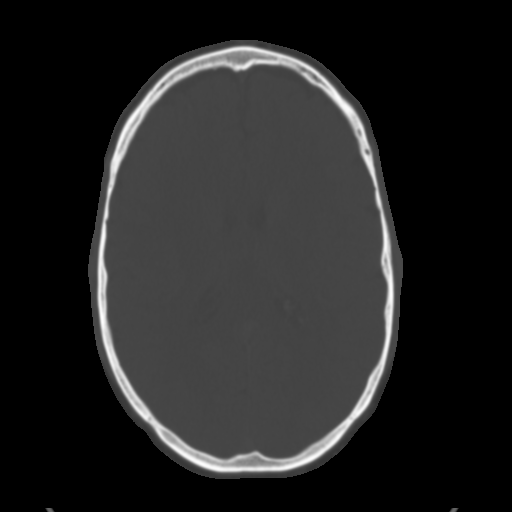
[im 22/35  brain]
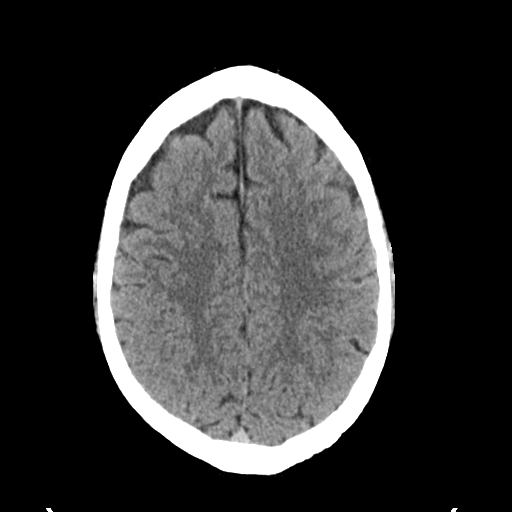
[im 25/35  brain]
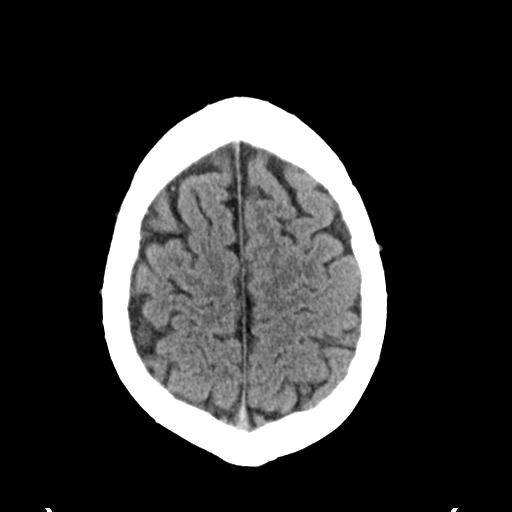
[im 29/35  brain]
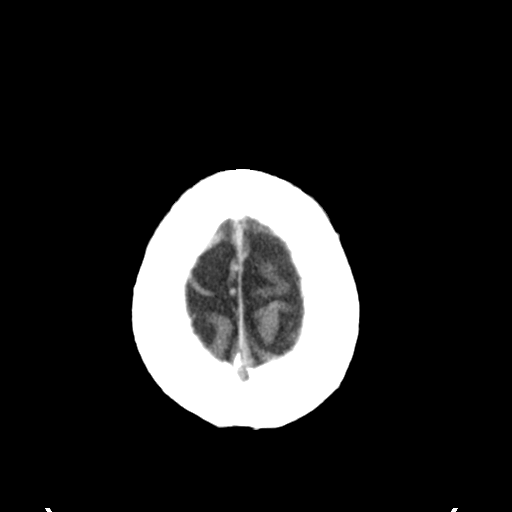
[im 32/35  brain]
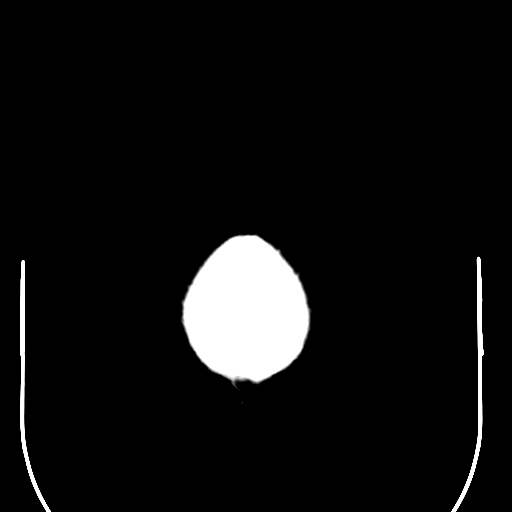
[im 32/35  bone]
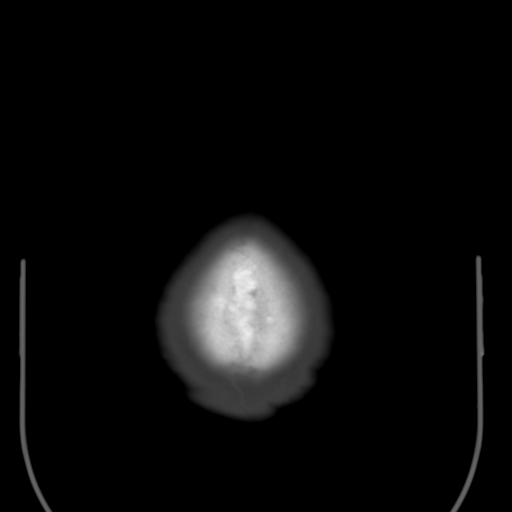

[Series 5: head 3.0 mpr cor · coronal · 0.33mm/px · 3 of 82 slices shown]
[im 28/82  brain]
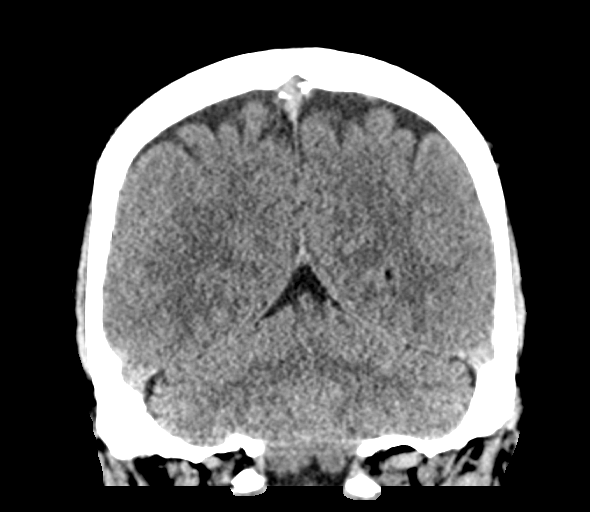
[im 37/82  brain]
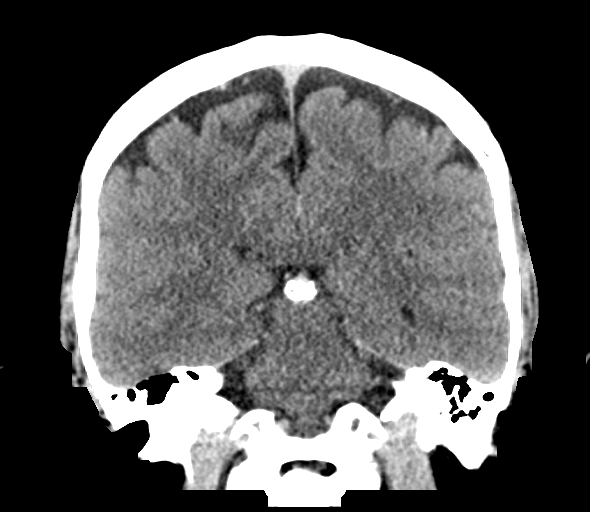
[im 46/82  brain]
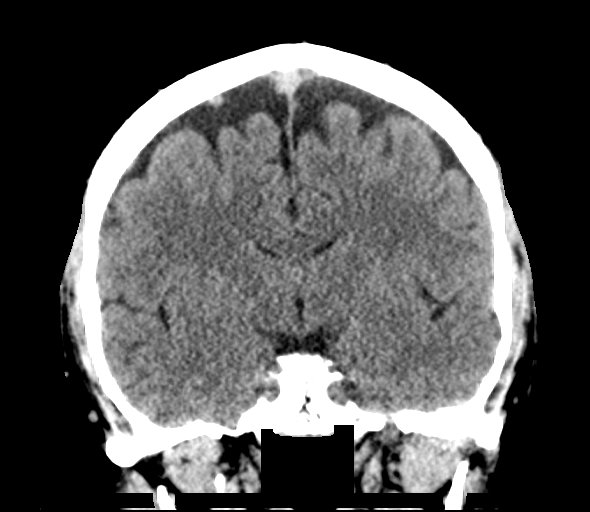

[Series 6: head 3.0 mpr sag · sagittal · 0.33mm/px · 3 of 67 slices shown]
[im 23/67  brain]
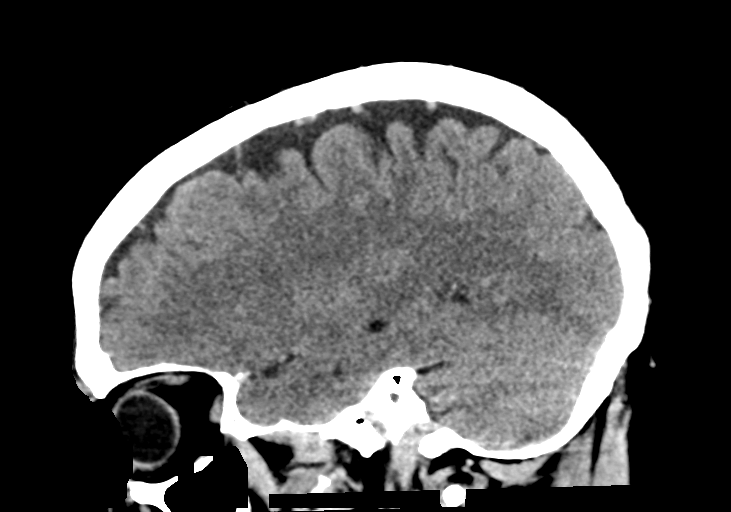
[im 34/67  brain]
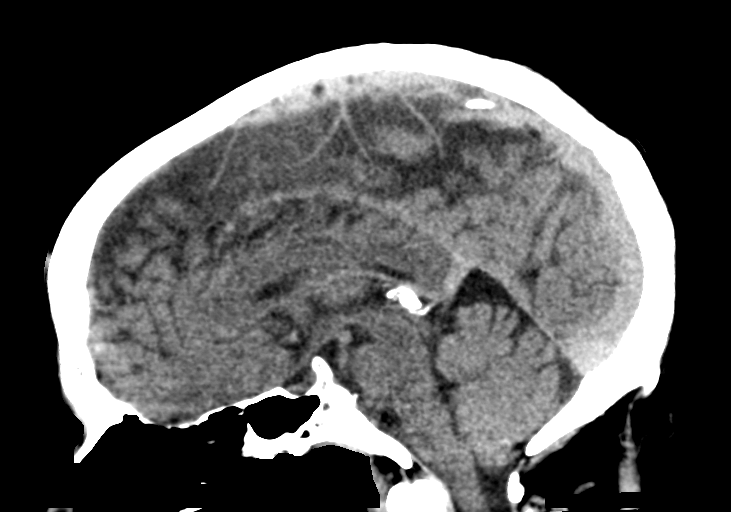
[im 45/67  brain]
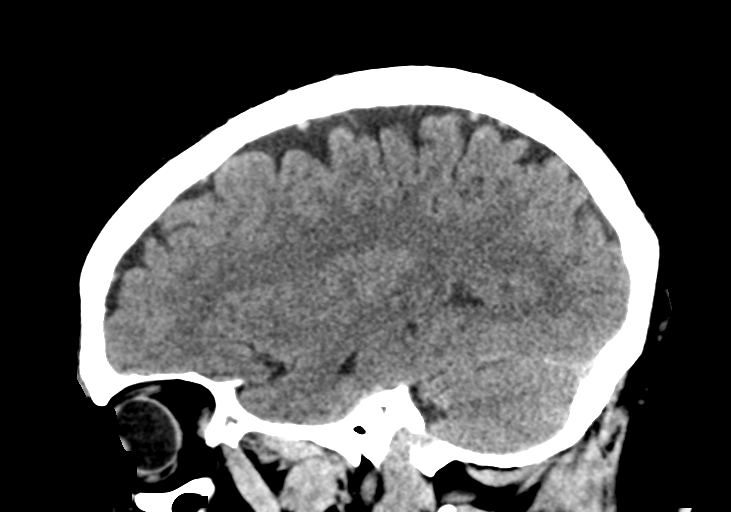

[15 of 47 positions shown; findings below may reference images not displayed]

FINDINGS: Brain: No evidence of acute infarction, hemorrhage, hydrocephalus,
extra-axial collection or mass lesion/mass effect.

Vascular: No hyperdense vessel or unexpected calcification.

Skull: Normal. Negative for fracture or focal lesion.

Sinuses/Orbits: No acute finding.

Other: None.
IMPRESSION: No acute intracranial pathology.

## 2022-07-08 ENCOUNTER — Other Ambulatory Visit: Payer: Self-pay | Admitting: Family Medicine

## 2022-08-11 ENCOUNTER — Other Ambulatory Visit (INDEPENDENT_AMBULATORY_CARE_PROVIDER_SITE_OTHER): Payer: BC Managed Care – PPO

## 2022-08-11 DIAGNOSIS — E1165 Type 2 diabetes mellitus with hyperglycemia: Secondary | ICD-10-CM

## 2022-08-11 LAB — CBC WITH DIFFERENTIAL/PLATELET
Basophils Absolute: 0 10*3/uL (ref 0.0–0.1)
Basophils Relative: 0.5 % (ref 0.0–3.0)
Eosinophils Absolute: 0.1 10*3/uL (ref 0.0–0.7)
Eosinophils Relative: 1.5 % (ref 0.0–5.0)
HCT: 46.9 % (ref 39.0–52.0)
Hemoglobin: 16.4 g/dL (ref 13.0–17.0)
Lymphocytes Relative: 38.2 % (ref 12.0–46.0)
Lymphs Abs: 2.4 10*3/uL (ref 0.7–4.0)
MCHC: 35.1 g/dL (ref 30.0–36.0)
MCV: 91.8 fl (ref 78.0–100.0)
Monocytes Absolute: 0.5 10*3/uL (ref 0.1–1.0)
Monocytes Relative: 7.2 % (ref 3.0–12.0)
Neutro Abs: 3.4 10*3/uL (ref 1.4–7.7)
Neutrophils Relative %: 52.6 % (ref 43.0–77.0)
Platelets: 178 10*3/uL (ref 150.0–400.0)
RBC: 5.11 Mil/uL (ref 4.22–5.81)
RDW: 13.1 % (ref 11.5–15.5)
WBC: 6.4 10*3/uL (ref 4.0–10.5)

## 2022-08-11 LAB — LIPID PANEL
Cholesterol: 129 mg/dL (ref 0–200)
HDL: 31.4 mg/dL — ABNORMAL LOW (ref >39.00)
LDL Cholesterol: 69 mg/dL (ref 0–99)
NonHDL: 97.12
Total CHOL/HDL Ratio: 4
Triglycerides: 139 mg/dL (ref 0.0–149.0)
VLDL: 27.8 mg/dL (ref 0.0–40.0)

## 2022-08-11 LAB — COMPREHENSIVE METABOLIC PANEL
ALT: 26 U/L (ref 0–53)
AST: 18 U/L (ref 0–37)
Albumin: 4.6 g/dL (ref 3.5–5.2)
Alkaline Phosphatase: 56 U/L (ref 39–117)
BUN: 17 mg/dL (ref 6–23)
CO2: 27 mEq/L (ref 19–32)
Calcium: 9.3 mg/dL (ref 8.4–10.5)
Chloride: 102 mEq/L (ref 96–112)
Creatinine, Ser: 0.61 mg/dL (ref 0.40–1.50)
GFR: 111.95 mL/min (ref >60.00)
Glucose, Bld: 199 mg/dL — ABNORMAL HIGH (ref 70–99)
Potassium: 4.2 mEq/L (ref 3.5–5.1)
Sodium: 137 mEq/L (ref 135–145)
Total Bilirubin: 0.5 mg/dL (ref 0.2–1.2)
Total Protein: 6.8 g/dL (ref 6.0–8.3)

## 2022-08-11 LAB — HEMOGLOBIN A1C: Hgb A1c MFr Bld: 9.6 % — ABNORMAL HIGH (ref 4.6–6.5)

## 2022-08-18 ENCOUNTER — Ambulatory Visit (INDEPENDENT_AMBULATORY_CARE_PROVIDER_SITE_OTHER): Payer: BC Managed Care – PPO | Admitting: Family Medicine

## 2022-08-18 ENCOUNTER — Encounter: Payer: Self-pay | Admitting: Family Medicine

## 2022-08-18 VITALS — BP 142/90 | HR 94 | Temp 97.8°F | Ht 73.0 in | Wt 260.2 lb

## 2022-08-18 DIAGNOSIS — R030 Elevated blood-pressure reading, without diagnosis of hypertension: Secondary | ICD-10-CM | POA: Diagnosis not present

## 2022-08-18 DIAGNOSIS — L820 Inflamed seborrheic keratosis: Secondary | ICD-10-CM | POA: Diagnosis not present

## 2022-08-18 DIAGNOSIS — Z23 Encounter for immunization: Secondary | ICD-10-CM

## 2022-08-18 DIAGNOSIS — L720 Epidermal cyst: Secondary | ICD-10-CM | POA: Diagnosis not present

## 2022-08-18 DIAGNOSIS — L821 Other seborrheic keratosis: Secondary | ICD-10-CM | POA: Diagnosis not present

## 2022-08-18 DIAGNOSIS — E1165 Type 2 diabetes mellitus with hyperglycemia: Secondary | ICD-10-CM | POA: Diagnosis not present

## 2022-08-18 DIAGNOSIS — D225 Melanocytic nevi of trunk: Secondary | ICD-10-CM | POA: Diagnosis not present

## 2022-08-18 MED ORDER — DAPAGLIFLOZIN PROPANEDIOL 5 MG PO TABS: 5.0000 mg | ORAL_TABLET | Freq: Every day | ORAL | 5 refills | Status: DC

## 2022-08-18 NOTE — Progress Notes (Signed)
Phone [phone] In person visit   Subjective:   Jack Hall. is a 52 y.o. year old very pleasant male patient who presents for/with See problem oriented charting Chief Complaint  Patient presents with   Follow-up   Diabetes    Pt is here for 14 week f/u on diabetes.   Past Medical History-  Patient Active Problem List   Diagnosis Date Noted   Nonobstructive CAD 09/16/2021    Priority: High   Elevated coronary artery calcium score 08/26/2021    Priority: High   Poorly controlled type 2 diabetes mellitus ([place]) 07/07/2020    Priority: High   Smoker 05/12/2015    Priority: High   Family history of premature CAD 05/12/2015    Priority: High   Genetic testing 08/01/2021    Priority: Medium    Hyperlipidemia associated with type 2 diabetes mellitus ([place]) 01/06/2021    Priority: Medium    Left groin hernia 11/10/2015    Priority: Medium    Obesity     Priority: Medium    Allergy 08/22/2021    Priority: Low   Hiatal hernia 08/22/2021    Priority: Low   History of colonic polyps 07/13/2021    Priority: Low   Epididymal cyst 05/09/2017    Priority: Low   Allergic rhinitis     Priority: Low   GERD (gastroesophageal reflux disease)     Priority: Low   Vitreomacular adhesion of both eyes 01/31/2022   Diabetes mellitus without complication ([place]) 38/18/2993    Medications- reviewed and updated Current Outpatient Medications  Medication Sig Dispense Refill   Accu-Chek Softclix Lancets lancets Use to test blood sugars daily. Dx: E11.9 100 each 3   aspirin EC 81 MG tablet Take 1 tablet (81 mg total) by mouth daily. Swallow whole. 90 tablet 3   Blood Glucose Monitoring Suppl (ACCU-CHEK AVIVA PLUS) w/Device KIT Use to test blood sugars daily.Dx: E11.9 1 kit 3   Continuous Blood Gluc Receiver (FREESTYLE LIBRE 2 READER) DEVI 1 each by Does not apply route daily at 2 PM. Use to check blood sugars. Dx: E11.9 1 each 3   Cyanocobalamin (VITAMIN B-12 PO) Take 1 tablet by  mouth daily.     dapagliflozin propanediol (FARXIGA) 5 MG TABS tablet Take 1 tablet (5 mg total) by mouth daily before breakfast. 30 tablet 5   fluticasone (FLONASE) 50 MCG/ACT nasal spray Place 1-2 sprays into both nostrils as needed for rhinitis or allergies.     glucose blood (ACCU-CHEK AVIVA PLUS) test strip Use to test blood sugars daily. Dx: E11.9 100 each 3   ibuprofen (ADVIL) 200 MG tablet Take 400 mg by mouth every 6 (six) hours as needed for headache or mild pain.     metFORMIN (GLUCOPHAGE) 1000 MG tablet TAKE 1 TABLET (1,000 MG TOTAL) BY MOUTH TWICE A DAY WITH A MEAL 180 tablet 3   mupirocin ointment (BACTROBAN) 2 % Apply 1 application topically 2 (two) times daily.     Omeprazole Magnesium (PRILOSEC OTC PO) Take 20 mg by mouth as needed for indigestion.     rosuvastatin (CRESTOR) 40 MG tablet TAKE 1 TABLET BY MOUTH EVERY DAY 90 tablet 3   No current facility-administered medications for this visit.     Objective:  BP (!) 142/90   Pulse 94   Temp 97.8 F (36.6 C)   Ht '6\' 1"'$  (1.854 m)   Wt 260 lb 3.2 oz (118 kg)   SpO2 96%   BMI 34.33 kg/m  Gen: NAD, resting comfortably CV: RRR no murmurs rubs or gallops Lungs: CTAB no crackles, wheeze, rhonchi     Assessment and Plan   # Diabetes-peak A1c 10.7 in June 2022 S: Medication:Metformin 1000 mg twice daily- no side effects - tried jardiance but felt dizzy and nauseous CBGs-Freestyle libre ordered- has but never started.  Exercise and diet- walking 5-6 days a week but not eating well Lab Results  Component Value Date   HGBA1C 9.6 (H) 08/11/2022   HGBA1C 8.7 (H) 05/05/2022   HGBA1C 7.3 (A) 01/10/2022  A/P: Diabetes not at ideal control- prefer under 7. Continue metformin. Add farxiga 5 mg. He wants to work on dietary changes as well and continue walking.  -discussed adding [place] as well but he wants to hold for now -a1c and cmp before visit  #Elevated blood pressure S: medication: none BP Readings from Last 3  Encounters:  08/18/22 (!) 142/90  05/12/22 138/88  03/15/22 (!) 153/95  A/P: blood pressure elevated- if stays in this range by follow up will need to formally diagnose hypertension- we did discuss improving diet and quitting smoking could help.   # smoking- 1/2 PPD  right now- encouraged cessation for blood pressure control and CAD- he declines  #HM- considering shingrix.  -prevnar 20 today  Recommended follow up: Return in about 3 months (around 11/16/2022) for physical or sooner if needed.Schedule b4 you leave. Future Appointments  Date Time Provider [place]  09/15/2022  8:00 AM Marin Olp, MD [facility]  02/01/2023 10:45 AM Rankin, Clent Demark, MD [facility] None   Lab/Order associations:   ICD-10-CM   1. Poorly controlled type 2 diabetes mellitus (HCC)  E11.65 Comprehensive metabolic panel    Hemoglobin A1c    2. Elevated blood pressure reading  R03.0       Meds ordered this encounter  Medications   dapagliflozin propanediol (FARXIGA) 5 MG TABS tablet    Sig: Take 1 tablet (5 mg total) by mouth daily before breakfast.    Dispense:  30 tablet    Refill:  5    Return precautions advised.  Garret Reddish, MD

## 2022-08-18 NOTE — Patient Instructions (Addendum)
Diabetes not at ideal control- prefer under 7. Continue metformin. Add farxiga 5 mg. He wants to work on dietary changes as well and continue walking.  -discussed adding [place] as well but he wants to hold for now  blood pressure elevated- if stays in this range by follow up will need to formally diagnose hypertension- we did discuss improving diet and quitting smoking could help. See dash eating plan  Pneumonia shot prevnar 20 today  Recommended follow up: Return in about 3 months (around 11/16/2022) for physical or sooner if needed.Schedule b4 you leave. Schedule labs 11/11/22 or later and visit after that

## 2022-09-11 ENCOUNTER — Encounter: Payer: BC Managed Care – PPO | Admitting: Family Medicine

## 2022-09-15 ENCOUNTER — Encounter: Payer: BC Managed Care – PPO | Admitting: Family Medicine

## 2022-11-21 ENCOUNTER — Other Ambulatory Visit (INDEPENDENT_AMBULATORY_CARE_PROVIDER_SITE_OTHER): Payer: BC Managed Care – PPO

## 2022-11-21 DIAGNOSIS — E1165 Type 2 diabetes mellitus with hyperglycemia: Secondary | ICD-10-CM | POA: Diagnosis not present

## 2022-11-21 LAB — COMPREHENSIVE METABOLIC PANEL
ALT: 24 U/L (ref 0–53)
AST: 19 U/L (ref 0–37)
Albumin: 4.4 g/dL (ref 3.5–5.2)
Alkaline Phosphatase: 63 U/L (ref 39–117)
BUN: 11 mg/dL (ref 6–23)
CO2: 27 mEq/L (ref 19–32)
Calcium: 9.1 mg/dL (ref 8.4–10.5)
Chloride: 101 mEq/L (ref 96–112)
Creatinine, Ser: 0.65 mg/dL (ref 0.40–1.50)
GFR: 109.61 mL/min (ref >60.00)
Glucose, Bld: 207 mg/dL — ABNORMAL HIGH (ref 70–99)
Potassium: 4.1 mEq/L (ref 3.5–5.1)
Sodium: 136 mEq/L (ref 135–145)
Total Bilirubin: 0.7 mg/dL (ref 0.2–1.2)
Total Protein: 6.8 g/dL (ref 6.0–8.3)

## 2022-11-21 LAB — HEMOGLOBIN A1C: Hgb A1c MFr Bld: 9.7 % — ABNORMAL HIGH (ref 4.6–6.5)

## 2022-11-28 ENCOUNTER — Ambulatory Visit (INDEPENDENT_AMBULATORY_CARE_PROVIDER_SITE_OTHER): Payer: BC Managed Care – PPO | Admitting: Family Medicine

## 2022-11-28 ENCOUNTER — Encounter: Payer: Self-pay | Admitting: Family Medicine

## 2022-11-28 VITALS — BP 162/100 | HR 87 | Temp 98.1°F | Ht 73.0 in | Wt 253.2 lb

## 2022-11-28 DIAGNOSIS — E1165 Type 2 diabetes mellitus with hyperglycemia: Secondary | ICD-10-CM | POA: Diagnosis not present

## 2022-11-28 DIAGNOSIS — E785 Hyperlipidemia, unspecified: Secondary | ICD-10-CM

## 2022-11-28 DIAGNOSIS — Z0000 Encounter for general adult medical examination without abnormal findings: Secondary | ICD-10-CM

## 2022-11-28 DIAGNOSIS — Z7984 Long term (current) use of oral hypoglycemic drugs: Secondary | ICD-10-CM

## 2022-11-28 DIAGNOSIS — E1169 Type 2 diabetes mellitus with other specified complication: Secondary | ICD-10-CM

## 2022-11-28 DIAGNOSIS — Z125 Encounter for screening for malignant neoplasm of prostate: Secondary | ICD-10-CM

## 2022-11-28 MED ORDER — DAPAGLIFLOZIN PROPANEDIOL 5 MG PO TABS: 5.0000 mg | ORAL_TABLET | Freq: Every day | ORAL | 5 refills | Status: DC

## 2022-11-28 NOTE — Progress Notes (Signed)
Phone: [phone]   Subjective:  Patient presents today for their annual physical. Chief complaint-noted.   See problem oriented charting- ROS- full  review of systems was completed and negative  except for: had cold last week- improving at this point- ongestion, runny nose, sinus pressure, sneezing, sore throat, voice change, headaches, cough, wheezing,  also some seasonal allergies- didn't take COVID test   The following were reviewed and entered/updated in epic: Past Medical History:  Diagnosis Date   Allergic rhinitis    otc flonase   Allergy    seasonal   Diabetes mellitus without complication (HCC)    Elevated coronary artery calcium score 08/26/2021   Epididymal cyst 05/09/2017   Felt mass in scrotum- saw urgent care "1. No testicular mass or specific findings of torsion or orchitis. 2. Single bilateral epididymal cysts or spermatoceles, 1.2 cm on the right and 0.5 cm on the left. 3. Small bilateral hydroceles."   Family history of premature CAD 06-05-2015   Father died of heart attack at age 59. Saw cardiology 12/16/15 who did stress test and no ischemia noted   Genetic testing 08/01/2021   [facility] Multi-Cancer Panel was Negative. Report date is 07/28/2021.  The Multi-Cancer + RNA Panel offered by [facility] includes sequencing and/or deletion/duplication analysis of the following 84 genes:  AIP*, ALK, APC*, ATM*, AXIN2*, BAP1*, BARD1*, BLM*, BMPR1A*, BRCA1*, BRCA2*, BRIP1*, CASR, CDC73*, CDH1*, CDK4, CDKN1B*, CDKN1C*, CDKN2A, CEBPA, CHEK2*, CTNNA1*, DICER1*, DIS3L2*, EGFR, EPCAM, FH*, F   GERD (gastroesophageal reflux disease)    prilosec- hiatal hernia   Hiatal hernia    History of colonic polyps 07/13/2021   Hyperlipidemia associated with type 2 diabetes mellitus (HCC) 01/06/2021   Left groin hernia 11/10/2015   May call for surgical referral in future-    Nonobstructive CAD 09/16/2021   -On 08/03/2021-Coronary calcium score of 39.4. This was 96 percentile for age   Obesity     Poorly controlled type 2 diabetes mellitus (HCC) 07/07/2020   New diagnosis December 2021   Smoker 06-05-2015   1/2 PPD   Vitreomacular adhesion of both eyes 01/31/2022   Patient Active Problem List   Diagnosis Date Noted   Nonobstructive CAD 09/16/2021    Priority: High   Elevated coronary artery calcium score 08/26/2021    Priority: High   Poorly controlled type 2 diabetes mellitus (HCC) 07/07/2020    Priority: High   Smoker 06-05-2015    Priority: High   Family history of premature CAD June 05, 2015    Priority: High   Genetic testing 08/01/2021    Priority: Medium    Hyperlipidemia associated with type 2 diabetes mellitus (HCC) 01/06/2021    Priority: Medium    Left groin hernia 11/10/2015    Priority: Medium    Obesity     Priority: Medium    Allergy 08/22/2021    Priority: Low   Hiatal hernia 08/22/2021    Priority: Low   History of colonic polyps 07/13/2021    Priority: Low   Epididymal cyst 05/09/2017    Priority: Low   Allergic rhinitis     Priority: Low   GERD (gastroesophageal reflux disease)     Priority: Low   Vitreomacular adhesion of both eyes 01/31/2022   Diabetes mellitus without complication (HCC) 08/22/2021   Past Surgical History:  Procedure Laterality Date   COLONOSCOPY  09/01/2019   Russella Dar   WISDOM TOOTH EXTRACTION     general anesthesia    Family History  Problem Relation Age of Onset  Arthritis Mother    Diabetes Mother        grandmother   CAD Father    Sudden death Father        heart attack 81- constant stress, poor food choices, smoked heavier than patient   Colon polyps Sister        77 sessile serrated polyps.    Cancer Paternal Uncle        unknown type   Kidney cancer Maternal Grandfather    Prostate cancer Paternal Grandfather    Esophageal cancer Neg Hx    Rectal cancer Neg Hx    Stomach cancer Neg Hx     Medications- reviewed and updated Current Outpatient Medications  Medication Sig Dispense Refill   Accu-Chek  Softclix Lancets lancets Use to test blood sugars daily. Dx: E11.9 100 each 3   aspirin EC 81 MG tablet Take 1 tablet (81 mg total) by mouth daily. Swallow whole. 90 tablet 3   Blood Glucose Monitoring Suppl (ACCU-CHEK AVIVA PLUS) w/Device KIT Use to test blood sugars daily.Dx: E11.9 1 kit 3   Continuous Blood Gluc Receiver (FREESTYLE LIBRE 2 READER) DEVI 1 each by Does not apply route daily at 2 PM. Use to check blood sugars. Dx: E11.9 1 each 3   Cyanocobalamin (VITAMIN B-12 PO) Take 1 tablet by mouth daily.     fluticasone (FLONASE) 50 MCG/ACT nasal spray Place 1-2 sprays into both nostrils as needed for rhinitis or allergies.     glucose blood (ACCU-CHEK AVIVA PLUS) test strip Use to test blood sugars daily. Dx: E11.9 100 each 3   ibuprofen (ADVIL) 200 MG tablet Take 400 mg by mouth every 6 (six) hours as needed for headache or mild pain.     metFORMIN (GLUCOPHAGE) 1000 MG tablet TAKE 1 TABLET (1,000 MG TOTAL) BY MOUTH TWICE A DAY WITH A MEAL 180 tablet 3   mupirocin ointment (BACTROBAN) 2 % Apply 1 application topically 2 (two) times daily.     Omeprazole Magnesium (PRILOSEC OTC PO) Take 20 mg by mouth as needed for indigestion.     rosuvastatin (CRESTOR) 40 MG tablet TAKE 1 TABLET BY MOUTH EVERY DAY 90 tablet 3   dapagliflozin propanediol (FARXIGA) 5 MG TABS tablet Take 1 tablet (5 mg total) by mouth daily before breakfast. 30 tablet 5   No current facility-administered medications for this visit.    Allergies-reviewed and updated No Known Allergies  Social History   Social History Narrative   Family: Single- not dating lately. Living alone now. 2020- got new poodle just before Covid 19.       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 No college      Hobbies: time at gym usually 4-5 days a week, friends and family, vacation      Right Handed    Lives in a one story home    Objective  Objective:   BP (!) 162/100   Pulse 87   Temp 98.1 F (36.7 C)   Ht 6\' 1"  (1.854 m)   Wt 253 lb 3.2 oz (114.9 kg)   SpO2 95%   BMI 33.41 kg/m  Gen: NAD, resting comfortably HEENT: Mucous membranes are moist. Oropharynx normal Neck: no thyromegaly CV: RRR no murmurs rubs or gallops Lungs: CTAB no crackles, wheeze, rhonchi Abdomen: soft/nontender/nondistended/normal bowel sounds. No rebound or guarding.  Ext: no edema Skin: warm, dry Neuro: grossly normal,  moves all extremities, PERRLA Hernia/bulge in left groin slightly larger   Assessment and Plan  52 y.o. male presenting for annual physical.  Health Maintenance counseling: 1. Anticipatory guidance: Patient counseled regarding regular dental exams -q6 months, eye exams - yearly,  avoiding smoking and second hand smoke- see below , limiting alcohol to 2 beverages per day - well under that, no illicit drugs - no recent use enen of marijuana.   2. Risk factor reduction:  Advised patient of need for regular exercise and diet rich and fruits and vegetables to reduce risk of heart attack and stroke.  Exercise- walking on pretty regular basis and may add weight lifitng.  Diet/weight management-weight down 7 lbs from last visit- trying to eat healthier.  Wt Readings from Last 3 Encounters:  11/28/22 253 lb 3.2 oz (114.9 kg)  08/18/22 260 lb 3.2 oz (118 kg)  05/12/22 256 lb 6.4 oz (116.3 kg)  3. Immunizations/screenings/ancillary studies-may get COVID at pharmacy in the fall. Shingrix- wants to fully recover from illness before doing this  Immunization History  Administered Date(s) Administered   Influenza Whole 05/03/2019   Influenza,inj,Quad PF,6+ Mos 05/05/2021, 05/12/2022   Influenza-Unspecified 04/30/2017, 04/22/2018, 05/05/2020   PFIZER(Purple Top)SARS-COV-2 Vaccination 10/02/2019, 10/23/2019, 07/02/2020   PNEUMOCOCCAL CONJUGATE-20 08/18/2022   Pfizer Covid-19 Vaccine Bivalent Booster 6yrs & up 05/06/2021   Tdap 11/10/2015  4. Prostate  cancer screening- low risk prior PSA trend but was not checked on recent labs- wants to do with next labs including a1c  Lab Results  Component Value Date   PSA 0.22 06/24/2021   PSA 0.29 07/06/2020   PSA 0.22 06/30/2019   5. Colon cancer screening - last colonoscopy 03/23/2021-Notes from Dr. Russella Dar when I reached out "8 precancerous polyps on this recent colonoscopy. Genetic counseling, testing is reasonable and we will refer him. Depending on the findings his follow interval could be shortened however 3 years for his next colonoscopy is within the guidelines. "  GI was to refer him to genetic counseling-unfortunately results came back reassuring-3-year repeat planned  6. Skin cancer screening/prevention- dermatology yearly.  advised regular sunscreen use. Denies worrisome, changing, or new skin lesions.  7.  STD screening- patient opts out-not currently sexually active 8. Smoking associated screening-current smoker- we will monitor UA with labs. lung cancer screening- he plans to call to enroll- referral still active from 05/12/22   Status of chronic or acute concerns   #working with a naturalist and is on some supplements and is going to help him with diet and quitting smoking. On coQ10 and feeling less achy and also taking vitamin D.   # Diabetes-peak A1c 10.7 in June 2022 S: Medication:Metformin 1000 mg twice daily- no side effects, farxiga 5 mg had issues with processing- needs me to resend CBGs-Freestyle libre ordered- has but never started Exercise and diet- exercising regularly Lab Results  Component Value Date   HGBA1C 9.7 (H) 11/21/2022   HGBA1C 9.6 (H) 08/11/2022   HGBA1C 8.7 (H) 05/05/2022  A/P: a1c above goal- has not started farxiga yet- he wants to start this and work on healthy eating and regular exercise and recheck in 3 months and 1 day after last check- August 8th or later then see me back  #hyperlipidemia- premature CAD history and father #Nonobstructive CAD-follows with  Dr. Tomie China S: Medication:Rosuvastatin 40 mg daily ( use to be Atorvastatin 40 mg daily), aspirin 81 mg -On 08/03/2021-Coronary calcium score of 39.4. This was 96 percentile for age -Low-risk stress test on 09/14/2021 -  Echocardiogram for murmur done 09/14/2021-no cause of murmur found but borderline dilation of the ascending aorta at 37 mm was noted  -no chest pain or shortness of breath  other than with the cold Lab Results  Component Value Date   CHOL 129 08/11/2022   HDL 31.40 (L) 08/11/2022   LDLCALC 69 08/11/2022   LDLDIRECT 47.0 09/16/2021   TRIG 139.0 08/11/2022   CHOLHDL 4 08/11/2022   A/P: lipids wwell controlled- continue current medications  Nonobstructive CAD- target Low Density Lipoprotein (LDL cholesterol) under 70- continue current medications as to goal   # smoking- 1/2 PPD or less- encouraged cessation particularly now with nonobstructive CAD/elevated coronary artery calcium scoring- not ready to quit but is working on cutting down    #Paresthesias of unclear etiology - much better- not as often- saw neurology about a year ago and plan was return only if worsening  #elevated blood pressure  S: medication: none Home readings #s:  no recent check BP Readings from Last 3 Encounters:  11/28/22 (!) 162/100  08/18/22 (!) 142/90  05/12/22 138/88  A/P: I am concerned you could be developing high blood pressure but want to make sure home readings are not in better range before deciding on medicine. I would like for you to buy/use a home cuff to check at least 4x a week. Your goal is <135/85 at least.  Bring your home cuff and your log of blood pressures with you to next visit. Update me in 2-3 weeks by mychart.   #Left inguinal hernia concern-seems to be getting bigger but no pain- can refer to surgeon in future- do want to get a1c down - he wants to hold off on referral for now  Recommended follow up: Return in about 14 weeks (around 03/06/2023) for followup or sooner if  needed.Schedule b4 you leave. Future Appointments  Date Time Provider Department Center  02/01/2023 10:45 AM Rankin, Alford Highland, MD [facility] None   Lab/Order associations:no labs today   ICD-10-CM   1. Preventative health care  Z00.00     2. Hyperlipidemia associated with type 2 diabetes mellitus (HCC) Chronic E11.69    E78.5     3. Poorly controlled type 2 diabetes mellitus (HCC)  E11.65 Urinalysis, Routine w reflex microscopic    Hemoglobin A1c    Comprehensive metabolic panel    4. Screening for prostate cancer  Z12.5 PSA      Meds ordered this encounter  Medications   dapagliflozin propanediol (FARXIGA) 5 MG TABS tablet    Sig: Take 1 tablet (5 mg total) by mouth daily before breakfast.    Dispense:  30 tablet    Refill:  5    Return precautions advised.  Tana Conch, MD

## 2022-11-28 NOTE — Patient Instructions (Addendum)
Let us know when you are ready to get your [facility] vaccine and if you get any other COVID vaccines. Can call back for nurse visit for shingrix when you feel better  a1c above goal- has not started farxiga yet- he wants to start this and work on healthy eating and regular exercise and recheck in 3 months and 1 day after last check- August 8th or later then see me back   I am concerned you could be developing high blood pressure but want to make sure home readings are not in better range before deciding on medicine. I would like for you to buy/use a home cuff to check at least 4x a week. Your goal is <135/85 at least.  Bring your home cuff and your log of blood pressures with you to next visit. Update me in 2-3 weeks by mychart.   Call for lung cancer screening follow up   If you start to get pain with hernia let us refer you- if drastic incrase in pain seek care as soon as possible    Recommended follow up: Return in about 14 weeks (around 03/06/2023) for followup or sooner if needed.Schedule b4 you leave.  With labs a few days before visit

## 2022-12-26 ENCOUNTER — Encounter: Payer: Self-pay | Admitting: Family Medicine

## 2023-02-01 ENCOUNTER — Encounter (INDEPENDENT_AMBULATORY_CARE_PROVIDER_SITE_OTHER): Payer: BC Managed Care – PPO | Admitting: Ophthalmology

## 2023-02-14 ENCOUNTER — Encounter (INDEPENDENT_AMBULATORY_CARE_PROVIDER_SITE_OTHER): Payer: Self-pay

## 2023-03-06 ENCOUNTER — Other Ambulatory Visit: Payer: BC Managed Care – PPO

## 2023-03-12 ENCOUNTER — Ambulatory Visit: Payer: BC Managed Care – PPO | Admitting: Family Medicine

## 2023-05-15 ENCOUNTER — Other Ambulatory Visit: Payer: BC Managed Care – PPO

## 2023-05-17 ENCOUNTER — Other Ambulatory Visit (INDEPENDENT_AMBULATORY_CARE_PROVIDER_SITE_OTHER): Payer: BC Managed Care – PPO

## 2023-05-17 DIAGNOSIS — E1165 Type 2 diabetes mellitus with hyperglycemia: Secondary | ICD-10-CM | POA: Diagnosis not present

## 2023-05-17 DIAGNOSIS — Z125 Encounter for screening for malignant neoplasm of prostate: Secondary | ICD-10-CM | POA: Diagnosis not present

## 2023-05-17 LAB — COMPREHENSIVE METABOLIC PANEL
ALT: 28 U/L (ref 0–53)
AST: 24 U/L (ref 0–37)
Albumin: 4.4 g/dL (ref 3.5–5.2)
Alkaline Phosphatase: 60 U/L (ref 39–117)
BUN: 11 mg/dL (ref 6–23)
CO2: 25 meq/L (ref 19–32)
Calcium: 9.1 mg/dL (ref 8.4–10.5)
Chloride: 102 meq/L (ref 96–112)
Creatinine, Ser: 0.63 mg/dL (ref 0.40–1.50)
GFR: 110.27 mL/min (ref >60.00)
Glucose, Bld: 203 mg/dL — ABNORMAL HIGH (ref 70–99)
Potassium: 4 meq/L (ref 3.5–5.1)
Sodium: 137 meq/L (ref 135–145)
Total Bilirubin: 0.8 mg/dL (ref 0.2–1.2)
Total Protein: 7 g/dL (ref 6.0–8.3)

## 2023-05-17 LAB — URINALYSIS, ROUTINE W REFLEX MICROSCOPIC
Bilirubin Urine: NEGATIVE
Hgb urine dipstick: NEGATIVE
Ketones, ur: NEGATIVE
Leukocytes,Ua: NEGATIVE
Nitrite: NEGATIVE
Specific Gravity, Urine: >=1.03 — AB (ref 1.000–1.030)
Total Protein, Urine: 100 — AB
Urine Glucose: >=1000 — AB
Urobilinogen, UA: 1 (ref 0.0–1.0)
pH: 6 (ref 5.0–8.0)

## 2023-05-17 LAB — HEMOGLOBIN A1C: Hgb A1c MFr Bld: 9.4 % — ABNORMAL HIGH (ref 4.6–6.5)

## 2023-05-17 LAB — PSA: PSA: 0.25 ng/mL (ref 0.10–4.00)

## 2023-05-22 ENCOUNTER — Ambulatory Visit (INDEPENDENT_AMBULATORY_CARE_PROVIDER_SITE_OTHER): Payer: BC Managed Care – PPO | Admitting: Family Medicine

## 2023-05-22 ENCOUNTER — Encounter: Payer: Self-pay | Admitting: Family Medicine

## 2023-05-22 VITALS — BP 136/86 | HR 97 | Temp 98.1°F | Ht 73.0 in | Wt 249.8 lb

## 2023-05-22 DIAGNOSIS — E1165 Type 2 diabetes mellitus with hyperglycemia: Secondary | ICD-10-CM | POA: Diagnosis not present

## 2023-05-22 DIAGNOSIS — I2510 Atherosclerotic heart disease of native coronary artery without angina pectoris: Secondary | ICD-10-CM | POA: Diagnosis not present

## 2023-05-22 DIAGNOSIS — Z23 Encounter for immunization: Secondary | ICD-10-CM | POA: Diagnosis not present

## 2023-05-22 DIAGNOSIS — F17200 Nicotine dependence, unspecified, uncomplicated: Secondary | ICD-10-CM

## 2023-05-22 DIAGNOSIS — E785 Hyperlipidemia, unspecified: Secondary | ICD-10-CM

## 2023-05-22 DIAGNOSIS — E1169 Type 2 diabetes mellitus with other specified complication: Secondary | ICD-10-CM

## 2023-05-22 DIAGNOSIS — Z7984 Long term (current) use of oral hypoglycemic drugs: Secondary | ICD-10-CM

## 2023-05-22 NOTE — Progress Notes (Signed)
Phone [phone] In person visit   Subjective:   Jack Hall. is a 52 y.o. year old very pleasant male patient who presents for/with See problem oriented charting Chief Complaint  Patient presents with   Medical Management of Chronic Issues   Diabetes    Go over recent labs.   joint aches    Pt states he is having joint aches thinks it is related to his statin.   Past Medical History-  Patient Active Problem List   Diagnosis Date Noted   Nonobstructive CAD 09/16/2021    Priority: High   Elevated coronary artery calcium score 08/26/2021    Priority: High   Poorly controlled type 2 diabetes mellitus (HCC) 07/07/2020    Priority: High   Smoker 05/12/2015    Priority: High   Family history of premature CAD 05/12/2015    Priority: High   Genetic testing 08/01/2021    Priority: Medium    Hyperlipidemia associated with type 2 diabetes mellitus (HCC) 01/06/2021    Priority: Medium    Left groin hernia 11/10/2015    Priority: Medium    Obesity     Priority: Medium    Allergy 08/22/2021    Priority: Low   Hiatal hernia 08/22/2021    Priority: Low   History of colonic polyps 07/13/2021    Priority: Low   Epididymal cyst 05/09/2017    Priority: Low   Allergic rhinitis     Priority: Low   GERD (gastroesophageal reflux disease)     Priority: Low   Vitreomacular adhesion of both eyes 01/31/2022   Diabetes mellitus without complication (HCC) 08/22/2021    Medications- reviewed and updated Current Outpatient Medications  Medication Sig Dispense Refill   Accu-Chek Softclix Lancets lancets Use to test blood sugars daily. Dx: E11.9 100 each 3   aspirin EC 81 MG tablet Take 1 tablet (81 mg total) by mouth daily. Swallow whole. 90 tablet 3   Blood Glucose Monitoring Suppl (ACCU-CHEK AVIVA PLUS) w/Device KIT Use to test blood sugars daily.Dx: E11.9 1 kit 3   Continuous Blood Gluc Receiver (FREESTYLE LIBRE 2 READER) DEVI 1 each by Does not apply route daily at 2 PM.  Use to check blood sugars. Dx: E11.9 1 each 3   Cyanocobalamin (VITAMIN B-12 PO) Take 1 tablet by mouth daily.     dapagliflozin propanediol (FARXIGA) 5 MG TABS tablet Take 1 tablet (5 mg total) by mouth daily before breakfast. 30 tablet 5   fluticasone (FLONASE) 50 MCG/ACT nasal spray Place 1-2 sprays into both nostrils as needed for rhinitis or allergies.     glucose blood (ACCU-CHEK AVIVA PLUS) test strip Use to test blood sugars daily. Dx: E11.9 100 each 3   ibuprofen (ADVIL) 200 MG tablet Take 400 mg by mouth every 6 (six) hours as needed for headache or mild pain.     metFORMIN (GLUCOPHAGE) 1000 MG tablet TAKE 1 TABLET (1,000 MG TOTAL) BY MOUTH TWICE A DAY WITH A MEAL 180 tablet 3   mupirocin ointment (BACTROBAN) 2 % Apply 1 application topically 2 (two) times daily.     Omeprazole Magnesium (PRILOSEC OTC PO) Take 20 mg by mouth as needed for indigestion.     rosuvastatin (CRESTOR) 40 MG tablet TAKE 1 TABLET BY MOUTH EVERY DAY 90 tablet 3   No current facility-administered medications for this visit.     Objective:  BP 136/86   Pulse 97   Temp 98.1 F (36.7 C)   Ht 6\' 1"  (1.854  m)   Wt 249 lb 12.8 oz (113.3 kg)   SpO2 97%   BMI 32.96 kg/m  Gen: NAD, resting comfortably CV: RRR no murmurs rubs or gallops Lungs: CTAB no crackles, wheeze, rhonchi Ext: no edema Skin: warm, dry     Assessment and Plan   # Diabetes-peak A1c 10.7 in June 2022 S: Medication:Metformin 1000 mg twice daily- no side effects,farxiga 5 mg (but had not started at May visit and ultimately was not cost effective at $500)  - tried jardiance but felt dizzy and nauseous CBGs-not checking Exercise and diet- exercising regularly but not making much effort on diet- feels has space to do so.    Lab Results  Component Value Date   HGBA1C 9.4 (H) 05/17/2023   HGBA1C 9.7 (H) 11/21/2022   HGBA1C 9.6 (H) 08/11/2022  A/P: Diabetes slightly improved with exercise- has gotten below 7 with intensive lifestyle change  in past - could retry farxiga with farxiga coupon but still may be expensive - could use insulin- he prefers not - or could try Ozempic or Mounjaro -discussed glipizide or glimipiride -ultimately he decides to work on lifestyle and weight loss and retry what got him down to a1c of 6.6 and find something he can do more long term such as some cheats on weekend  #hyperlipidemia- premature CAD history and father #Nonobstructive CAD-follows with Dr. Tomie China S: Medication:Rosuvastatin 40 mg daily ( use to be Atorvastatin 40 mg daily), aspirin 81 mg -On 08/03/2021-Coronary calcium score of 39.4. This was 38 percentile for age -He reports feeling some joint achiness-at last visit reported better on coenzyme q10. Achiness often worse in evening- feels like an old person when stands up in the evening. -no chest pain or shortness of breath   Lab Results  Component Value Date   CHOL 129 08/11/2022   HDL 31.40 (L) 08/11/2022   LDLCALC 69 08/11/2022   LDLDIRECT 47.0 09/16/2021   TRIG 139.0 08/11/2022   CHOLHDL 4 08/11/2022   A/P: nonobstructive coronary artery disease asymptomatic - continue current medications except with achiness on rosuvastatin he may trial off 2-4 weeks then retrial every other day and see how he does- we can work back up over time possibly  #elevated blood pressure  S: At last visit I had asked him to do some Home readings and update me with his readings and we sent a follow-up message on 12/26/2022 but never heard back from him Home readings #s: no recent checks BP Readings from Last 3 Encounters:  05/22/23 136/86  11/28/22 (!) 162/100  08/18/22 (!) 142/90  A/P: blood pressure improved on repeat but still not ideal- he is going to do some home monitoring hopefully and let me know  #smoking - 1/2 ppd- encouraged cessation but he's not ready- could help with blood pressure   Recommended follow up: Return in about 14 weeks (around 08/28/2023) for followup or sooner if  needed.Schedule b4 you leave.  Lab/Order associations:   ICD-10-CM   1. Coronary artery disease involving native coronary artery of native heart without angina pectoris  I25.10     2. Poorly controlled type 2 diabetes mellitus (HCC)  E11.65 Comprehensive metabolic panel    Hemoglobin A1c    Microalbumin / creatinine urine ratio    3. Smoker  F17.200     4. Hyperlipidemia associated with type 2 diabetes mellitus (HCC)  E11.69    E78.5     5. Need for shingles vaccine  Z23 Zoster Recombinant (Shingrix )  No orders of the defined types were placed in this encounter.   Return precautions advised.  Tana Conch, MD

## 2023-05-22 NOTE — Patient Instructions (Addendum)
Get diabetic eye exam scheduled  -ultimately he decides to work on lifestyle and weight loss and retry what got him down to a1c of 6.6 and find something he can do more long term such as some cheats on weekend -this should also help blood pressure- please consider dropping off blood pressure readings with [place]  achiness on rosuvastatin he may trial off 2-4 weeks then retrial every other day and see how he does- we can work back up over time if possibly  Recommended follow up: Return in about 14 weeks (around 08/28/2023) for followup or sooner if needed.Schedule b4 you leave. You could also go ahead and schedule physical 14 weeks after next visit Can do second shingles then

## 2023-07-04 ENCOUNTER — Other Ambulatory Visit: Payer: Self-pay | Admitting: Family Medicine

## 2023-08-24 DIAGNOSIS — D225 Melanocytic nevi of trunk: Secondary | ICD-10-CM | POA: Diagnosis not present

## 2023-08-24 DIAGNOSIS — D2261 Melanocytic nevi of right upper limb, including shoulder: Secondary | ICD-10-CM | POA: Diagnosis not present

## 2023-08-24 DIAGNOSIS — L570 Actinic keratosis: Secondary | ICD-10-CM | POA: Diagnosis not present

## 2023-08-24 DIAGNOSIS — D2372 Other benign neoplasm of skin of left lower limb, including hip: Secondary | ICD-10-CM | POA: Diagnosis not present

## 2023-08-24 DIAGNOSIS — L821 Other seborrheic keratosis: Secondary | ICD-10-CM | POA: Diagnosis not present

## 2023-08-28 ENCOUNTER — Other Ambulatory Visit: Payer: BC Managed Care – PPO

## 2023-09-04 ENCOUNTER — Encounter: Payer: Self-pay | Admitting: Family Medicine

## 2023-09-04 ENCOUNTER — Ambulatory Visit: Payer: BC Managed Care – PPO | Admitting: Family Medicine

## 2023-09-04 VITALS — BP 142/88 | HR 97 | Temp 98.0°F | Ht 73.0 in | Wt 249.4 lb

## 2023-09-04 DIAGNOSIS — I2510 Atherosclerotic heart disease of native coronary artery without angina pectoris: Secondary | ICD-10-CM | POA: Diagnosis not present

## 2023-09-04 DIAGNOSIS — Z7984 Long term (current) use of oral hypoglycemic drugs: Secondary | ICD-10-CM | POA: Diagnosis not present

## 2023-09-04 DIAGNOSIS — Z23 Encounter for immunization: Secondary | ICD-10-CM

## 2023-09-04 DIAGNOSIS — E1169 Type 2 diabetes mellitus with other specified complication: Secondary | ICD-10-CM | POA: Diagnosis not present

## 2023-09-04 DIAGNOSIS — E1165 Type 2 diabetes mellitus with hyperglycemia: Secondary | ICD-10-CM | POA: Diagnosis not present

## 2023-09-04 DIAGNOSIS — E785 Hyperlipidemia, unspecified: Secondary | ICD-10-CM

## 2023-09-04 LAB — POCT GLYCOSYLATED HEMOGLOBIN (HGB A1C): Hemoglobin A1C: 9.5 % — AB (ref 4.0–5.6)

## 2023-09-04 NOTE — Addendum Note (Signed)
Addended by: Gwenette Greet on: 09/04/2023 04:32 PM   Modules accepted: Orders

## 2023-09-04 NOTE — Progress Notes (Signed)
Phone [phone] In person visit   Subjective:   Jack Hall. is a 53 y.o. year old very pleasant male patient who presents for/with See problem oriented charting Chief Complaint  Patient presents with   14 week f/u   Diabetes    Needs to schedule DEE   Past Medical History-  Patient Active Problem List   Diagnosis Date Noted   Nonobstructive CAD 09/16/2021    Priority: High   Elevated coronary artery calcium score 08/26/2021    Priority: High   Poorly controlled type 2 diabetes mellitus (HCC) 07/07/2020    Priority: High   Smoker 05/12/2015    Priority: High   Family history of premature CAD 05/12/2015    Priority: High   Genetic testing 08/01/2021    Priority: Medium    Hyperlipidemia associated with type 2 diabetes mellitus (HCC) 01/06/2021    Priority: Medium    Left groin hernia 11/10/2015    Priority: Medium    Obesity     Priority: Medium    Allergy 08/22/2021    Priority: Low   Hiatal hernia 08/22/2021    Priority: Low   History of colonic polyps 07/13/2021    Priority: Low   Epididymal cyst 05/09/2017    Priority: Low   Allergic rhinitis     Priority: Low   GERD (gastroesophageal reflux disease)     Priority: Low   Vitreomacular adhesion of both eyes 01/31/2022   Diabetes mellitus without complication (HCC) 08/22/2021    Medications- reviewed and updated Current Outpatient Medications  Medication Sig Dispense Refill   Accu-Chek Softclix Lancets lancets Use to test blood sugars daily. Dx: E11.9 100 each 3   aspirin EC 81 MG tablet Take 1 tablet (81 mg total) by mouth daily. Swallow whole. 90 tablet 3   Blood Glucose Monitoring Suppl (ACCU-CHEK AVIVA PLUS) w/Device KIT Use to test blood sugars daily.Dx: E11.9 1 kit 3   Continuous Blood Gluc Receiver (FREESTYLE LIBRE 2 READER) DEVI 1 each by Does not apply route daily at 2 PM. Use to check blood sugars. Dx: E11.9 1 each 3   Cyanocobalamin (VITAMIN B-12 PO) Take 1 tablet by mouth daily.      fluticasone (FLONASE) 50 MCG/ACT nasal spray Place 1-2 sprays into both nostrils as needed for rhinitis or allergies.     glucose blood (ACCU-CHEK AVIVA PLUS) test strip Use to test blood sugars daily. Dx: E11.9 100 each 3   ibuprofen (ADVIL) 200 MG tablet Take 400 mg by mouth every 6 (six) hours as needed for headache or mild pain.     metFORMIN (GLUCOPHAGE) 1000 MG tablet TAKE 1 TABLET (1,000 MG TOTAL) BY MOUTH TWICE A DAY WITH A MEAL 180 tablet 3   mupirocin ointment (BACTROBAN) 2 % Apply 1 application topically 2 (two) times daily.     Omeprazole Magnesium (PRILOSEC OTC PO) Take 20 mg by mouth as needed for indigestion.     rosuvastatin (CRESTOR) 40 MG tablet TAKE 1 TABLET BY MOUTH EVERY DAY 90 tablet 3   No current facility-administered medications for this visit.     Objective:  BP (!) 142/88   Pulse 97   Temp 98 F (36.7 C)   Ht 6\' 1"  (1.854 m)   Wt 249 lb 6.4 oz (113.1 kg)   SpO2 95%   BMI 32.90 kg/m  Gen: NAD, visibly discouraged about A1c results CV: RRR no murmurs rubs or gallops Lungs: CTAB no crackles, wheeze, rhonchi Ext: no edema Skin: warm,  dry    Assessment and Plan    # Diabetes-peak A1c 10.7 in June 2022 S: Medication:Metformin 1000 mg twice daily- no side effects - didn't try farxiga 5 mg after last visit due to const  -Last visit number slightly improved with adding exercise but had not made substantial changes in diet Exercise and diet- -weight stable from last visit. Feels hasn't been as strict as previous. Still exercising other than when he was ill about 2 weeks ago Lab Results  Component Value Date   HGBA1C 9.5 (A) 09/04/2023   HGBA1C 9.4 (H) 05/17/2023   HGBA1C 9.7 (H) 11/21/2022  A/P: poor control with a1c of Point of Care (POC) 9.5 today -He wants to continue to work on healthy eating/exercise/weight loss-strongly prefers not to add medicine  We discussed treatment options - Farxiga costly - Strongly prefers no insulin - Very concerned  about side effects with Ozempic or Mounjaro -Actos not ideal with smoking history -discussed glipizide or glimipiride but hypoglycemia and weight gain concerns -Discussed Januvia but declines  #hyperlipidemia- premature CAD history and father #Nonobstructive CAD-follows with Dr. Tomie China S: Medication:Rosuvastatin 40 mg daily ( use to be Atorvastatin 40 mg daily), aspirin 81 mg -On 08/03/2021-Coronary calcium score of 39.4. This was 96 percentile for age -Low-grade stress test on 09/14/2021 -No chest pain shortness of breath reported  Lab Results  Component Value Date   CHOL 129 08/11/2022   HDL 31.40 (L) 08/11/2022   LDLCALC 69 08/11/2022   LDLDIRECT 47.0 09/16/2021   TRIG 139.0 08/11/2022   CHOLHDL 4 08/11/2022    A/P: Nonobstructive coronary disease asymptomatic-continue current medication - Due for lipid panel but will try to do at next visit/hopefully physical-suspect well-controlled as consistent with medicine    # smoking- 1/2 PPD or less-  - continues to cut down- as low as 1/4 pack a day- encouraged full cessation but he is not ready for this  #concern for blood pressure-discussed 2 out of last 3 readings elevated and potential need for medicine-she wants to work on diet and exercise before starting medicine-recheck next visit BP Readings from Last 3 Encounters:  09/04/23 (!) 142/88  05/22/23 136/86  11/28/22 (!) 162/100   Recommended follow up: Return in about 14 weeks (around 12/11/2023) for physical or sooner if needed.Schedule b4 you leave. -She wants to do labs before next visit and these were ordered today  Lab/Order associations:   ICD-10-CM   1. Poorly controlled type 2 diabetes mellitus (HCC)  E11.65 Urine Microalbumin w/creat. ratio    POCT glycosylated hemoglobin (Hb A1C)    Comprehensive metabolic panel    CBC with Differential/Platelet    Lipid panel    HgB A1c    2. Hyperlipidemia associated with type 2 diabetes mellitus (HCC)  E11.69 Comprehensive  metabolic panel   Z61.0 CBC with Differential/Platelet    Lipid panel    3. Coronary artery disease involving native coronary artery of native heart without angina pectoris  I25.10     4. Need for shingles vaccine  Z23 Zoster Recombinant (Shingrix )      No orders of the defined types were placed in this encounter.   Return precautions advised.  Tana Conch, MD

## 2023-09-04 NOTE — Patient Instructions (Addendum)
A1c high at 9.5- discussed some treatment options but you wante due to buckle down on healthy eating and regular exercise and weight loss efforts and recheck in 14 weeks  Blood pressure slightly high- weight loss and exercise and quitting smoking can help- if doesn't improve may need to look at medicine as well  Get diabetic eye exam scheduled.  Please stop by lab before you go- urine only If you have mychart- we will send your results within 3 business days of [place] receiving them.  If you do not have mychart- we will call you about results within 5 business days of [place] receiving them.  *please also note that you will see labs on mychart as soon as they post. I will later go in and write notes on them- will say "notes from Dr. Durene Cal"   Recommended follow up: Return in about 14 weeks (around 12/11/2023) for physical or sooner if needed.Schedule b4 you leave.

## 2023-09-05 ENCOUNTER — Encounter: Payer: Self-pay | Admitting: Family Medicine

## 2023-09-05 LAB — MICROALBUMIN / CREATININE URINE RATIO
Creatinine, Urine: 131 mg/dL (ref 20–320)
Microalb Creat Ratio: 387 mg/g{creat} — ABNORMAL HIGH (ref <30)
Microalb, Ur: 50.7 mg/dL

## 2023-09-06 NOTE — Progress Notes (Signed)
 LMTCB

## 2023-09-07 ENCOUNTER — Other Ambulatory Visit: Payer: Self-pay

## 2023-09-07 DIAGNOSIS — E1165 Type 2 diabetes mellitus with hyperglycemia: Secondary | ICD-10-CM

## 2023-09-24 DIAGNOSIS — E1165 Type 2 diabetes mellitus with hyperglycemia: Secondary | ICD-10-CM

## 2023-10-12 ENCOUNTER — Other Ambulatory Visit: Payer: Self-pay

## 2023-10-22 MED ORDER — LISINOPRIL 10 MG PO TABS: 10.0000 mg | ORAL_TABLET | Freq: Every day | ORAL | 3 refills | Status: DC

## 2023-10-22 NOTE — Addendum Note (Signed)
 Addended byZoe Lan on: 10/22/2023 09:19 AM   Modules accepted: Orders

## 2023-12-12 ENCOUNTER — Other Ambulatory Visit (INDEPENDENT_AMBULATORY_CARE_PROVIDER_SITE_OTHER): Payer: BC Managed Care – PPO

## 2023-12-12 ENCOUNTER — Other Ambulatory Visit: Payer: Self-pay

## 2023-12-12 DIAGNOSIS — E1165 Type 2 diabetes mellitus with hyperglycemia: Secondary | ICD-10-CM

## 2023-12-12 DIAGNOSIS — E1169 Type 2 diabetes mellitus with other specified complication: Secondary | ICD-10-CM

## 2023-12-12 DIAGNOSIS — E785 Hyperlipidemia, unspecified: Secondary | ICD-10-CM

## 2023-12-12 LAB — CBC WITH DIFFERENTIAL/PLATELET
Basophils Absolute: 0 10*3/uL (ref 0.0–0.1)
Basophils Relative: 0.5 % (ref 0.0–3.0)
Eosinophils Absolute: 0.1 10*3/uL (ref 0.0–0.7)
Eosinophils Relative: 1.7 % (ref 0.0–5.0)
HCT: 47.7 % (ref 39.0–52.0)
Hemoglobin: 16.3 g/dL (ref 13.0–17.0)
Lymphocytes Relative: 33.1 % (ref 12.0–46.0)
Lymphs Abs: 2.3 10*3/uL (ref 0.7–4.0)
MCHC: 34.2 g/dL (ref 30.0–36.0)
MCV: 93.5 fl (ref 78.0–100.0)
Monocytes Absolute: 0.4 10*3/uL (ref 0.1–1.0)
Monocytes Relative: 6.3 % (ref 3.0–12.0)
Neutro Abs: 4.1 10*3/uL (ref 1.4–7.7)
Neutrophils Relative %: 58.4 % (ref 43.0–77.0)
Platelets: 185 10*3/uL (ref 150.0–400.0)
RBC: 5.1 Mil/uL (ref 4.22–5.81)
RDW: 13.2 % (ref 11.5–15.5)
WBC: 7 10*3/uL (ref 4.0–10.5)

## 2023-12-12 LAB — BASIC METABOLIC PANEL WITH GFR
BUN: 11 mg/dL (ref 6–23)
CO2: 30 meq/L (ref 19–32)
Calcium: 9.4 mg/dL (ref 8.4–10.5)
Chloride: 101 meq/L (ref 96–112)
Creatinine, Ser: 0.73 mg/dL (ref 0.40–1.50)
GFR: 105.05 mL/min (ref >60.00)
Glucose, Bld: 149 mg/dL — ABNORMAL HIGH (ref 70–99)
Potassium: 4.1 meq/L (ref 3.5–5.1)
Sodium: 137 meq/L (ref 135–145)

## 2023-12-12 LAB — LIPID PANEL
Cholesterol: 101 mg/dL (ref 0–200)
HDL: 38.4 mg/dL — ABNORMAL LOW (ref >39.00)
LDL Cholesterol: 47 mg/dL (ref 0–99)
NonHDL: 62.69
Total CHOL/HDL Ratio: 3
Triglycerides: 77 mg/dL (ref 0.0–149.0)
VLDL: 15.4 mg/dL (ref 0.0–40.0)

## 2023-12-12 LAB — MICROALBUMIN / CREATININE URINE RATIO
Creatinine,U: 120.3 mg/dL
Microalb Creat Ratio: 256.2 mg/g — ABNORMAL HIGH (ref 0.0–30.0)
Microalb, Ur: 30.8 mg/dL — ABNORMAL HIGH (ref 0.0–1.9)

## 2023-12-12 LAB — HEMOGLOBIN A1C: Hgb A1c MFr Bld: 7.8 % — ABNORMAL HIGH (ref 4.6–6.5)

## 2023-12-13 ENCOUNTER — Ambulatory Visit: Payer: Self-pay | Admitting: Family Medicine

## 2023-12-17 ENCOUNTER — Encounter: Payer: Self-pay | Admitting: Family Medicine

## 2023-12-17 ENCOUNTER — Ambulatory Visit (INDEPENDENT_AMBULATORY_CARE_PROVIDER_SITE_OTHER): Payer: BC Managed Care – PPO | Admitting: Family Medicine

## 2023-12-17 VITALS — BP 132/76 | HR 80 | Ht 73.0 in | Wt 244.4 lb

## 2023-12-17 DIAGNOSIS — K4090 Unilateral inguinal hernia, without obstruction or gangrene, not specified as recurrent: Secondary | ICD-10-CM

## 2023-12-17 DIAGNOSIS — E785 Hyperlipidemia, unspecified: Secondary | ICD-10-CM

## 2023-12-17 DIAGNOSIS — I2510 Atherosclerotic heart disease of native coronary artery without angina pectoris: Secondary | ICD-10-CM

## 2023-12-17 DIAGNOSIS — Z7984 Long term (current) use of oral hypoglycemic drugs: Secondary | ICD-10-CM

## 2023-12-17 DIAGNOSIS — E1169 Type 2 diabetes mellitus with other specified complication: Secondary | ICD-10-CM

## 2023-12-17 DIAGNOSIS — R809 Proteinuria, unspecified: Secondary | ICD-10-CM

## 2023-12-17 DIAGNOSIS — E1129 Type 2 diabetes mellitus with other diabetic kidney complication: Secondary | ICD-10-CM | POA: Diagnosis not present

## 2023-12-17 DIAGNOSIS — Z7985 Long-term (current) use of injectable non-insulin antidiabetic drugs: Secondary | ICD-10-CM

## 2023-12-17 NOTE — Patient Instructions (Addendum)
 Get diabetic eye exam scheduled.  Urgent surgery referral for painful hernia- call me if you don't get a visit scheduled by Wednesday  As always encouraged smoking cessation  Phenomenal job on a1c improvement- keep up the great work!   Recommended follow up: Return for next already scheduled visit or sooner if needed.  Labs next visit

## 2023-12-17 NOTE — Progress Notes (Signed)
 Phone [phone] In person visit   Subjective:   Jack Nakama. is a 53 y.o. year old very pleasant male patient who presents for/with See problem oriented charting Chief Complaint  Patient presents with   Diabetes    Pt here for 14 week f/u. Needs to schedule DEE.    Hernia    Pt c/o left inguinal hernia flare up since last Thursday    Past Medical History-  Patient Active Problem List   Diagnosis Date Noted   Nonobstructive CAD 09/16/2021    Priority: High   Elevated coronary artery calcium  score 08/26/2021    Priority: High   Poorly controlled type 2 diabetes mellitus (HCC) 07/07/2020    Priority: High   Smoker 05/12/2015    Priority: High   Family history of premature CAD 05/12/2015    Priority: High   Genetic testing 08/01/2021    Priority: Medium    Hyperlipidemia associated with type 2 diabetes mellitus (HCC) 01/06/2021    Priority: Medium    Left groin hernia 11/10/2015    Priority: Medium    Obesity     Priority: Medium    Allergy 08/22/2021    Priority: Low   Hiatal hernia 08/22/2021    Priority: Low   History of colonic polyps 07/13/2021    Priority: Low   Epididymal cyst 05/09/2017    Priority: Low   Allergic rhinitis     Priority: Low   GERD (gastroesophageal reflux disease)     Priority: Low   Vitreomacular adhesion of both eyes 01/31/2022   Diabetes mellitus without complication (HCC) 08/22/2021    Medications- reviewed and updated Current Outpatient Medications  Medication Sig Dispense Refill   Accu-Chek Softclix Lancets lancets Use to test blood sugars daily. Dx: E11.9 100 each 3   aspirin  EC 81 MG tablet Take 1 tablet (81 mg total) by mouth daily. Swallow whole. 90 tablet 3   Blood Glucose Monitoring Suppl (ACCU-CHEK AVIVA PLUS) w/Device KIT Use to test blood sugars daily.Dx: E11.9 1 kit 3   Continuous Blood Gluc Receiver (FREESTYLE LIBRE 2 READER) DEVI 1 each by Does not apply route daily at 2 PM. Use to check blood sugars.  Dx: E11.9 1 each 3   Cyanocobalamin (VITAMIN B-12 PO) Take 1 tablet by mouth daily.     fluticasone (FLONASE) 50 MCG/ACT nasal spray Place 1-2 sprays into both nostrils as needed for rhinitis or allergies.     glucose blood (ACCU-CHEK AVIVA PLUS) test strip Use to test blood sugars daily. Dx: E11.9 100 each 3   ibuprofen (ADVIL) 200 MG tablet Take 400 mg by mouth every 6 (six) hours as needed for headache or mild pain.     lisinopril  (ZESTRIL ) 10 MG tablet Take 1 tablet (10 mg total) by mouth daily. 90 tablet 3   metFORMIN  (GLUCOPHAGE ) 1000 MG tablet TAKE 1 TABLET (1,000 MG TOTAL) BY MOUTH TWICE A DAY WITH A MEAL 180 tablet 3   mupirocin ointment (BACTROBAN) 2 % Apply 1 application topically 2 (two) times daily.     Omeprazole Magnesium (PRILOSEC OTC PO) Take 20 mg by mouth as needed for indigestion.     rosuvastatin  (CRESTOR ) 40 MG tablet TAKE 1 TABLET BY MOUTH EVERY DAY 90 tablet 3   No current facility-administered medications for this visit.     Objective:  BP 132/76   Pulse 80   Ht 6\' 1"  (1.854 m)   Wt 244 lb 6.4 oz (110.9 kg)   SpO2 97%  BMI 32.24 kg/m  Gen: NAD, resting comfortably CV: RRR no murmurs rubs or gallops Lungs: CTAB no crackles, wheeze, rhonchi Ext: no edema Significant visible hernia in left inguinal area    Assessment and Plan   # Left inguinal hernia - As noted issues with pain and bulging since last Thursday- worst he has noted up to 10/10 with lifting but avoiding lifting- otherwise peak 6/10. More tightness/bulge than usual. Still feels better with sitting or laying. When really hurting feels more expanded- still passing stool and gas thankfully- knows if worsening pain or not passing stool/gas to go to ER - Able to climb 2 flights of stairs without chest pain or shortness of breath and prior reassuring cardiac workup other than nonobstructive CAD-would not have to have further cardiac clearance- see CAD below  -A1c should be okay to move forward with  surgery   -URGENT referral to general surgical team with his level of pain  and size of hernia    # Diabetes-peak A1c 10.7 in June 2022 S: Medication:Metformin  1000 mg twice daily- no side effects -Lisinopril  started with microalbumin to creatinine ratio up to 387 from 160s (lab error at original check we believed it to only be 16 initially) - tried jardiance  but felt dizzy and nauseous -Farxiga  too costly CBGs-Freestyle libre ordered- has but never started - not checking Exercise and diet- has cut out unhealthy foods with sparing splurge. Occasional carbohydrates but much less sugar. Walking an hour a day- pushing faster and going a little harder. Down 5 lbs on our scales Lab Results  Component Value Date   HGBA1C 7.8 (H) 12/12/2023   HGBA1C 9.5 (A) 09/04/2023   HGBA1C 9.4 (H) 05/17/2023   A/P: diabetes drastically improved. Still above ideal goal 7 or less but has moved significantly towards target. He wants to continue working on diet/exercise and hold off on medications. Just in case for future-We discussed treatment options:  - Farxiga  costly, didn't tolerare jardiance  - Strongly prefers no insulin - Very concerned about side effects with Ozempic or Mounjaro -Actos not ideal with smoking history -discussed glipizide or glimipiride but hypoglycemia and weight gain concerns -Discussed Januvia but declines -of the options would be most open to Rybelsus if needed in [place]  #elevated blood pressure reading S: medication: Lisinopril  10 mg Home readings #s: has cuff but not checking BP Readings from Last 3 Encounters: 12/17/23 : (!) 148/72--> 132/76 09/04/23 : (!) 142/88 05/22/23 : 136/86 A/P: on lisinopril  primarily for renal protection but helping blood pressure as well  #hyperlipidemia- premature CAD history and father #Nonobstructive CAD-follows with Dr. Lafayette Pierre S: Medication:Rosuvastatin  40 mg daily ( use to be Atorvastatin  40 mg daily), aspirin  81 mg -On 08/03/2021-Coronary  calcium  score of 39.4. This was 96 percentile for age -Low-grade stress test on 09/14/2021 -no chest pain or shortness of breath  Lab Results  Component Value Date   CHOL 101 12/12/2023   HDL 38.40 (L) 12/12/2023   LDLCALC 47 12/12/2023   LDLDIRECT 47.0 09/16/2021   TRIG 77.0 12/12/2023   CHOLHDL 3 12/12/2023   A/P: nonobstructive coronary artery disease asymptomatic - has had excellent prior cardiac workup- would not need further clearance for surgery though surgery may send request to [facility] cardiology- biggest thing would be quitting smoking but with level of pain with hernia I don't think we have time to wait on that -still encouraged cessation   # smoking- 1/2 PPD or less- encouraged cessation particularly now with nonobstructive CAD/elevated coronary artery calcium  scoring-  not ready to quit  -biggest modifiable risk factor  Recommended follow up: Return for next already scheduled visit or sooner if needed. Future Appointments  Date Time Provider Department Center  03/14/2024 10:00 AM Almira Jaeger, MD [facility]    Lab/Order associations:   ICD-10-CM   1. Unilateral inguinal hernia without obstruction or gangrene, recurrence not specified  K40.90 Ambulatory referral to General Surgery    2. Controlled type 2 diabetes mellitus with microalbuminuria, without long-term current use of insulin (HCC)  E11.29    R80.9     3. Coronary artery disease involving native coronary artery of native heart without angina pectoris  I25.10     4. Hyperlipidemia associated with type 2 diabetes mellitus (HCC)  E11.69    E78.5     5. Left groin hernia  K40.90       No orders of the defined types were placed in this encounter.   Return precautions advised.  Clarisa Crooked, MD

## 2023-12-20 DIAGNOSIS — K4090 Unilateral inguinal hernia, without obstruction or gangrene, not specified as recurrent: Secondary | ICD-10-CM | POA: Diagnosis not present

## 2023-12-21 ENCOUNTER — Encounter: Payer: Self-pay | Admitting: Family Medicine

## 2023-12-24 ENCOUNTER — Ambulatory Visit: Payer: Self-pay | Admitting: General Surgery

## 2023-12-24 NOTE — Progress Notes (Signed)
 Sent message, via epic in basket, requesting orders in epic from [facility] adviser.

## 2023-12-25 NOTE — Patient Instructions (Signed)
 SURGICAL WAITING ROOM VISITATION  Patients having surgery or a procedure may have no more than 2 support people in the waiting area - these visitors may rotate.    Children under the age of 41 must have an adult with them who is not the patient.  Visitors with respiratory illnesses are discouraged from visiting and should remain at home.  If the patient needs to stay at the hospital during part of their recovery, the visitor guidelines for inpatient rooms apply. Pre-op nurse will coordinate an appropriate time for 1 support person to accompany patient in pre-op.  This support person may not rotate.    Please refer to the [facility] website for the visitor guidelines for Inpatients (after your surgery is over and you are in a regular room).       Your procedure is scheduled on: 01/16/24   Report to [facility] Main Entrance    Report to admitting at : 8:45 AM   Call this number if you have problems the morning of surgery [phone]   Eat a light diet the day before surgery.  Examples including soups, broths, toast, yogurt, mashed potatoes.  Things to avoid include carbonated beverages (fizzy beverages), raw fruits and raw vegetables, or beans.   If your bowels are filled with gas, your surgeon will have difficulty visualizing your pelvic organs which increases your surgical risks.  Do not eat food :After Midnight.   After Midnight you may have the following liquids until : 8:00 AM DAY OF SURGERY  Water Non-Citrus Juices (without pulp, NO RED-Apple, White grape, White cranberry) Black Coffee (NO MILK/CREAM OR CREAMERS, sugar ok)  Clear Tea (NO MILK/CREAM OR CREAMERS, sugar ok) regular and decaf                             Plain Jell-O (NO RED)                                           Fruit ices (not with fruit pulp, NO RED)                                     Popsicles (NO RED)                                                               Sports drinks like Gatorade (NO  RED)              FOLLOW ANY ADDITIONAL PRE OP INSTRUCTIONS YOU RECEIVED FROM YOUR SURGEON'S OFFICE!!!   Oral Hygiene is also important to reduce your risk of infection.                                    Remember - BRUSH YOUR TEETH THE MORNING OF SURGERY WITH YOUR REGULAR TOOTHPASTE  DENTURES WILL BE REMOVED PRIOR TO SURGERY PLEASE DO NOT APPLY "Poly grip" OR ADHESIVES!!!   Do NOT smoke after Midnight   Stop all vitamins and herbal supplements  7 days before surgery.   Take these medicines the morning of surgery with A SIP OF WATER: omeprazole.  DO NOT TAKE ANY ORAL DIABETIC MEDICATIONS DAY OF YOUR SURGERY                              You may not have any metal on your body including hair pins, jewelry, and body piercing             Do not wear lotions, powders, perfumes/cologne, or deodorant              Men may shave face and neck.   Do not bring valuables to the hospital. [facility] IS NOT             RESPONSIBLE   FOR VALUABLES.   Contacts, glasses, dentures or bridgework may not be worn into surgery.   Bring small overnight bag day of surgery.   DO NOT BRING YOUR HOME MEDICATIONS TO THE HOSPITAL. PHARMACY WILL DISPENSE MEDICATIONS LISTED ON YOUR MEDICATION LIST TO YOU DURING YOUR ADMISSION IN THE HOSPITAL!    Patients discharged on the day of surgery will not be allowed to drive home.  Someone NEEDS to stay with you for the first 24 hours after anesthesia.   Special Instructions: Bring a copy of your healthcare power of attorney and living will documents the day of surgery if you haven't scanned them before.              Please read over the following fact sheets you were given: IF YOU HAVE QUESTIONS ABOUT YOUR PRE-OP INSTRUCTIONS PLEASE CALL [phone]   If you received a COVID test during your pre-op visit  it is requested that you wear a mask when out in public, stay away from anyone that may not be feeling well and notify your surgeon if you develop symptoms. If  you test positive for Covid or have been in contact with anyone that has tested positive in the last 10 days please notify you surgeon.    [facility] - Preparing for Surgery Before surgery, you can play an important role.  Because skin is not sterile, your skin needs to be as free of germs as possible.  You can reduce the number of germs on your skin by washing with CHG (chlorahexidine gluconate) soap before surgery.  CHG is an antiseptic cleaner which kills germs and bonds with the skin to continue killing germs even after washing. Please DO NOT use if you have an allergy to CHG or antibacterial soaps.  If your skin becomes reddened/irritated stop using the CHG and inform your nurse when you arrive at Short Stay. Do not shave (including legs and underarms) for at least 48 hours prior to the first CHG shower.  You may shave your face/neck. Please follow these instructions carefully:  1.  Shower with CHG Soap the night before surgery and the  morning of Surgery.  2.  If you choose to wash your hair, wash your hair first as usual with your  normal  shampoo.  3.  After you shampoo, rinse your hair and body thoroughly to remove the  shampoo.                           4.  Use CHG as you would any other liquid soap.  You can apply chg directly  to the skin and wash  Gently with a scrungie or clean washcloth.  5.  Apply the CHG Soap to your body ONLY FROM THE NECK DOWN.   Do not use on face/ open                           Wound or open sores. Avoid contact with eyes, ears mouth and genitals (private parts).                       Wash face,  Genitals (private parts) with your normal soap.             6.  Wash thoroughly, paying special attention to the area where your surgery  will be performed.  7.  Thoroughly rinse your body with warm water from the neck down.  8.  DO NOT shower/wash with your normal soap after using and rinsing off  the CHG Soap.                9.  Pat yourself dry  with a clean towel.            10.  Wear clean pajamas.            11.  Place clean sheets on your bed the night of your first shower and do not  sleep with pets. Day of Surgery : Do not apply any lotions/deodorants the morning of surgery.  Please wear clean clothes to the hospital/surgery center.  FAILURE TO FOLLOW THESE INSTRUCTIONS MAY RESULT IN THE CANCELLATION OF YOUR SURGERY PATIENT SIGNATURE_________________________________  NURSE SIGNATURE__________________________________  ________________________________________________________________________

## 2023-12-26 ENCOUNTER — Encounter (HOSPITAL_COMMUNITY)
Admission: RE | Admit: 2023-12-26 | Discharge: 2023-12-26 | Disposition: A | Discharge status: Home / Self Care | Source: Ambulatory Visit | Attending: General Surgery | Admitting: General Surgery

## 2023-12-26 ENCOUNTER — Encounter (HOSPITAL_COMMUNITY): Payer: Self-pay

## 2023-12-26 ENCOUNTER — Other Ambulatory Visit: Payer: Self-pay

## 2023-12-26 VITALS — BP 165/103 | HR 98 | Temp 98.8°F | Ht 73.0 in | Wt 240.0 lb

## 2023-12-26 DIAGNOSIS — I10 Essential (primary) hypertension: Secondary | ICD-10-CM | POA: Diagnosis not present

## 2023-12-26 DIAGNOSIS — Z01818 Encounter for other preprocedural examination: Secondary | ICD-10-CM | POA: Insufficient documentation

## 2023-12-26 DIAGNOSIS — E119 Type 2 diabetes mellitus without complications: Secondary | ICD-10-CM | POA: Diagnosis not present

## 2023-12-26 DIAGNOSIS — E1165 Type 2 diabetes mellitus with hyperglycemia: Secondary | ICD-10-CM

## 2023-12-26 HISTORY — DX: Essential (primary) hypertension: I10

## 2023-12-26 LAB — CBC
HCT: 45.9 % (ref 39.0–52.0)
Hemoglobin: 15.9 g/dL (ref 13.0–17.0)
MCH: 32.4 pg (ref 26.0–34.0)
MCHC: 34.6 g/dL (ref 30.0–36.0)
MCV: 93.7 fL (ref 80.0–100.0)
Platelets: 185 10*3/uL (ref 150–400)
RBC: 4.9 MIL/uL (ref 4.22–5.81)
RDW: 12.6 % (ref 11.5–15.5)
WBC: 9.6 10*3/uL (ref 4.0–10.5)
nRBC: 0 % (ref 0.0–0.2)

## 2023-12-26 LAB — BASIC METABOLIC PANEL WITH GFR
Anion gap: 10 (ref 5–15)
BUN: 15 mg/dL (ref 6–20)
CO2: 24 mmol/L (ref 22–32)
Calcium: 9.4 mg/dL (ref 8.9–10.3)
Chloride: 102 mmol/L (ref 98–111)
Creatinine, Ser: 0.67 mg/dL (ref 0.61–1.24)
GFR, Estimated: >60 mL/min (ref >60)
Glucose, Bld: 225 mg/dL — ABNORMAL HIGH (ref 70–99)
Potassium: 3.4 mmol/L — ABNORMAL LOW (ref 3.5–5.1)
Sodium: 136 mmol/L (ref 135–145)

## 2023-12-26 LAB — GLUCOSE, CAPILLARY: Glucose-Capillary: 252 mg/dL — ABNORMAL HIGH (ref 70–99)

## 2023-12-26 NOTE — Progress Notes (Addendum)
 For Anesthesia: PCP - Almira Jaeger, MD . Holli Lunger: 12/17/23 Cardiologist - Nelia Balzarine, MD. LOV: 03/15/22    Bowel Prep reminder:N/A  Chest x-ray - CT cardiac: 08/03/21 EKG -  Stress Test - 12/16/15 ECHO -  Cardiac Cath -  Pacemaker/ICD device last checked: Pacemaker orders received: Device Rep notified:  Spinal Cord Stimulator:  Sleep Study - N/A CPAP -   Fasting Blood Sugar - N/A Checks Blood Sugar __0___ times a day Date and result of last Hgb A1c-7.8: 12/12/23  Last dose of GLP1 agonist- N/A GLP1 instructions:   Last dose of SGLT-2 inhibitors- N/A SGLT-2 instructions:   Blood Thinner Instructions: Aspirin  Instructions: Last Dose:  Activity level: Can go up a flight of stairs and activities of daily living without stopping and without chest pain and/or shortness of breath   Able to exercise without chest pain and/or shortness of breath  Anesthesia review: Hx: HTN,DIA,Smoker,CAD. BP was elevated during PST: 191/99,165/103. As per pt. He came from the dentist office and he was having some feelings than, that made him very anxious.He was told that if BP is elevated the DOS,his surgery can be cancelled.  Patient denies shortness of breath, fever, cough and chest pain at PAT appointment   Patient verbalized understanding of instructions that were reviewed over the telephone.

## 2024-01-01 ENCOUNTER — Encounter (HOSPITAL_COMMUNITY): Payer: Self-pay

## 2024-01-01 NOTE — Anesthesia Preprocedure Evaluation (Addendum)
 Anesthesia Evaluation  Patient identified by MRN, date of birth, ID band Patient awake    Reviewed: Allergy & Precautions, H&P , NPO status , Patient's Chart, lab work & pertinent test results  Airway Mallampati: II   Neck ROM: full    Dental   Pulmonary Current Smoker   breath sounds clear to auscultation       Cardiovascular hypertension,  Rhythm:regular Rate:Normal     Neuro/Psych  Neuromuscular disease    GI/Hepatic hiatal hernia,GERD  ,,  Endo/Other  diabetes, Type 2    Renal/GU      Musculoskeletal   Abdominal   Peds  Hematology   Anesthesia Other Findings   Reproductive/Obstetrics                              Anesthesia Physical Anesthesia Plan  ASA: 3  Anesthesia Plan: General   Post-op Pain Management:    Induction: Intravenous  PONV Risk Score and Plan: 1 and Ondansetron, Dexamethasone, Midazolam and Treatment may vary due to age or medical condition  Airway Management Planned: Oral ETT  Additional Equipment:   Intra-op Plan:   Post-operative Plan: Extubation in OR  Informed Consent: I have reviewed the patients History and Physical, chart, labs and discussed the procedure including the risks, benefits and alternatives for the proposed anesthesia with the patient or authorized representative who has indicated his/her understanding and acceptance.     Dental advisory given  Plan Discussed with: CRNA, Anesthesiologist and Surgeon  Anesthesia Plan Comments: (See PAT note from 6/11)         Anesthesia Quick Evaluation

## 2024-01-01 NOTE — Progress Notes (Signed)
 DISCUSSION: Jack Hall is a 53 yo male who presents to PAT prior to REPAIR, HERNIA, INGUINAL, ROBOT-ASSISTED, LAPAROSCOPIC, USING MESH on 01/16/24 with Dr. Davonna Estes. PMH of every day smoking, HTN, CAD (by CT), GERD, DM.  Patient was evaluated by Cardiology in the past due to elevated coronary calcium  score on CT. Seen by Dr. Lafayette Pierre in 2023. He underwent a stress test and echo which were both grossly normal. Has not needed ongoing f/u with Cardiology.  Pt follows closely with PCP. Last seen on 12/17/23. A1c has improved from prior (down to 7.8 on 12/12/23). He was cleared for surgery:  Able to climb 2 flights of stairs without chest pain or shortness of breath and prior reassuring cardiac workup other than nonobstructive CAD-would not have to have further cardiac clearance  BP noted to be elevated at PAT visit. Per pt he had just come from the dentist and was anxious. He does have hx of white coat HTN. BP controlled at PCP's office.  VS: BP (!) 165/103   Pulse 98   Temp 37.1 C (Oral)   Ht 6' 1 (1.854 m)   Wt 108.9 kg   SpO2 98%   BMI 31.66 kg/m   PROVIDERS: Almira Jaeger, MD   LABS: Labs reviewed: Acceptable for surgery. (all labs ordered are listed, but only abnormal results are displayed)  Labs Reviewed  BASIC METABOLIC PANEL WITH GFR - Abnormal; Notable for the following components:      Result Value   Potassium 3.4 (*)    Glucose, Bld 225 (*)    All other components within normal limits  GLUCOSE, CAPILLARY - Abnormal; Notable for the following components:   Glucose-Capillary 252 (*)    All other components within normal limits  CBC     EKG 12/26/23:  Normal sinus rhythm, rate 93 Rightward axis Cannot rule out Inferior infarct , age undetermined  CV:  Echo 09/14/2021:  IMPRESSIONS    1. Left ventricular ejection fraction, by estimation, is 60 to 65%. The left ventricle has normal function. The left ventricle has no regional wall motion abnormalities.  There is mild left ventricular hypertrophy. Left ventricular diastolic parameters were normal.  2. Right ventricular systolic function is normal. The right ventricular size is normal. There is normal pulmonary artery systolic pressure.  3. The mitral valve is normal in structure. No evidence of mitral valve regurgitation. No evidence of mitral stenosis.  4. The aortic valve is normal in structure. Aortic valve regurgitation is not visualized. No aortic stenosis is present.  5. There is borderline dilatation of the ascending aorta, measuring 37 mm.  6. The inferior vena cava is normal in size with greater than 50% respiratory variability, suggesting right atrial pressure of 3 mmHg.  Stress test 09/14/2021:    Findings are consistent with no ischemia and no prior myocardial infarction. The study is low risk.   No ST deviation was noted.   Left ventricular function is normal. Nuclear stress EF: 61 %. The left ventricular ejection fraction is normal (55-65%). End diastolic cavity size is normal.   Prior study not available for comparison.  CT calcium  score 08/03/2021:  IMPRESSION: Coronary calcium  score of 39.4. This was 42 percentile for age-, race-, and sex-matched controls.   Past Medical History:  Diagnosis Date   Allergic rhinitis    otc flonase   Allergy    seasonal   Diabetes mellitus without complication (HCC)    Elevated coronary artery calcium  score 08/26/2021   Epididymal cyst  05/09/2017   Felt mass in scrotum- saw urgent care 1. No testicular mass or specific findings of torsion or orchitis. 2. Single bilateral epididymal cysts or spermatoceles, 1.2 cm on the right and 0.5 cm on the left. 3. Small bilateral hydroceles.   Family history of premature CAD 05-17-2015   Father died of heart attack at age 21. Saw cardiology 12/16/15 who did stress test and no ischemia noted   Genetic testing 08/01/2021   [facility] Multi-Cancer Panel was Negative. Report date is 07/28/2021.  The  Multi-Cancer + RNA Panel offered by [employer] includes sequencing and/or deletion/duplication analysis of the following 84 genes:  AIP*, ALK, APC*, ATM*, AXIN2*, BAP1*, BARD1*, BLM*, BMPR1A*, BRCA1*, BRCA2*, BRIP1*, CASR, CDC73*, CDH1*, CDK4, CDKN1B*, CDKN1C*, CDKN2A, CEBPA, CHEK2*, CTNNA1*, DICER1*, DIS3L2*, EGFR, EPCAM, FH*, F   GERD (gastroesophageal reflux disease)    prilosec- hiatal hernia   Hiatal hernia    History of colonic polyps 07/13/2021   Hyperlipidemia associated with type 2 diabetes mellitus (HCC) 01/06/2021   Hypertension    Left groin hernia 11/10/2015   May call for surgical referral in future-    Nonobstructive CAD 09/16/2021   -On 08/03/2021-Coronary calcium  score of 39.4. This was 96 percentile for age   Obesity    Poorly controlled type 2 diabetes mellitus (HCC) 07/07/2020   New diagnosis December 2021   Smoker 2015/05/17   1/2 PPD   Vitreomacular adhesion of both eyes 01/31/2022    Past Surgical History:  Procedure Laterality Date   COLONOSCOPY  09/01/2019   Sandrea Cruel   WISDOM TOOTH EXTRACTION     general anesthesia    MEDICATIONS:  Accu-Chek Softclix Lancets lancets   Ascorbic Acid (VITAMIN C) 1000 MG tablet   aspirin  EC 81 MG tablet   Blood Glucose Monitoring Suppl (ACCU-CHEK AVIVA PLUS) w/Device KIT   Cholecalciferol (VITAMIN D3 PO)   Coenzyme Q10 (COQ10) 100 MG CAPS   Continuous Blood Gluc Receiver (FREESTYLE LIBRE 2 READER) DEVI   Cyanocobalamin (VITAMIN B-12 PO)   fluticasone (FLONASE) 50 MCG/ACT nasal spray   glucose blood (ACCU-CHEK AVIVA PLUS) test strip   ibuprofen (ADVIL) 200 MG tablet   lisinopril  (ZESTRIL ) 10 MG tablet   MAGNESIUM GLYCINATE PO   Menaquinone-7 (K2 PO)   METAMUCIL FIBER PO   metFORMIN  (GLUCOPHAGE ) 1000 MG tablet   MILK THISTLE PO   Omega-3 Fatty Acids (FISH OIL) 1000 MG CAPS   Omeprazole Magnesium (PRILOSEC OTC PO)   OVER THE COUNTER MEDICATION   rosuvastatin  (CRESTOR ) 40 MG tablet   vitamin A 3 MG (10000 UNITS) capsule    No current facility-administered medications for this encounter.   Antoinette Kirschner [facility] Surgical Short Stay/Anesthesiology [facility] Phone [phone] 01/01/2024 3:44 PM

## 2024-01-16 ENCOUNTER — Ambulatory Visit (HOSPITAL_COMMUNITY)
Admission: RE | Admit: 2024-01-16 | Discharge: 2024-01-16 | Disposition: A | Discharge status: Home / Self Care | Source: Ambulatory Visit | Attending: General Surgery | Admitting: General Surgery

## 2024-01-16 ENCOUNTER — Encounter (HOSPITAL_COMMUNITY)
Admission: RE | Disposition: A | Discharge status: Home / Self Care | Payer: Self-pay | Source: Ambulatory Visit | Attending: General Surgery

## 2024-01-16 ENCOUNTER — Ambulatory Visit (HOSPITAL_COMMUNITY): Payer: Self-pay | Admitting: Physician Assistant

## 2024-01-16 ENCOUNTER — Other Ambulatory Visit: Payer: Self-pay

## 2024-01-16 ENCOUNTER — Encounter (HOSPITAL_COMMUNITY): Payer: Self-pay | Admitting: General Surgery

## 2024-01-16 DIAGNOSIS — E119 Type 2 diabetes mellitus without complications: Secondary | ICD-10-CM | POA: Insufficient documentation

## 2024-01-16 DIAGNOSIS — K449 Diaphragmatic hernia without obstruction or gangrene: Secondary | ICD-10-CM | POA: Insufficient documentation

## 2024-01-16 DIAGNOSIS — I10 Essential (primary) hypertension: Secondary | ICD-10-CM | POA: Insufficient documentation

## 2024-01-16 DIAGNOSIS — K219 Gastro-esophageal reflux disease without esophagitis: Secondary | ICD-10-CM | POA: Diagnosis not present

## 2024-01-16 DIAGNOSIS — K4090 Unilateral inguinal hernia, without obstruction or gangrene, not specified as recurrent: Secondary | ICD-10-CM | POA: Diagnosis not present

## 2024-01-16 DIAGNOSIS — F17210 Nicotine dependence, cigarettes, uncomplicated: Secondary | ICD-10-CM | POA: Insufficient documentation

## 2024-01-16 DIAGNOSIS — Z7984 Long term (current) use of oral hypoglycemic drugs: Secondary | ICD-10-CM | POA: Insufficient documentation

## 2024-01-16 HISTORY — PX: XI ROBOTIC ASSISTED INGUINAL HERNIA REPAIR WITH MESH: SHX6706

## 2024-01-16 LAB — GLUCOSE, CAPILLARY
Glucose-Capillary: 207 mg/dL — ABNORMAL HIGH (ref 70–99)
Glucose-Capillary: 194 mg/dL — ABNORMAL HIGH (ref 70–99)
Glucose-Capillary: 183 mg/dL — ABNORMAL HIGH (ref 70–99)

## 2024-01-16 SURGERY — REPAIR, HERNIA, INGUINAL, ROBOT-ASSISTED, LAPAROSCOPIC, USING MESH
Anesthesia: General | Laterality: Left

## 2024-01-16 MED ORDER — BUPIVACAINE-EPINEPHRINE (PF) 0.25% -1:200000 IJ SOLN
INTRAMUSCULAR | Status: AC
Start: 2024-01-16 — End: 2024-01-16
  Filled 2024-01-16: qty 30

## 2024-01-16 MED ORDER — DEXAMETHASONE SODIUM PHOSPHATE 10 MG/ML IJ SOLN
INTRAMUSCULAR | Status: DC | PRN
  Administered 2024-01-16: 10 mg via INTRAVENOUS

## 2024-01-16 MED ORDER — FENTANYL CITRATE PF 50 MCG/ML IJ SOSY: 25.0000 ug | PREFILLED_SYRINGE | INTRAMUSCULAR | Status: DC | PRN

## 2024-01-16 MED ORDER — PROPOFOL 10 MG/ML IV BOLUS
INTRAVENOUS | Status: AC
  Filled 2024-01-16: qty 20

## 2024-01-16 MED ORDER — ROCURONIUM BROMIDE 10 MG/ML (PF) SYRINGE
PREFILLED_SYRINGE | INTRAVENOUS | Status: AC
  Filled 2024-01-16: qty 10

## 2024-01-16 MED ORDER — MIDAZOLAM HCL 2 MG/2ML IJ SOLN
INTRAMUSCULAR | Status: AC
  Filled 2024-01-16: qty 2

## 2024-01-16 MED ORDER — LACTATED RINGERS IV SOLN: INTRAVENOUS | Status: DC

## 2024-01-16 MED ORDER — CHLORHEXIDINE GLUCONATE CLOTH 2 % EX PADS: 6.0000 | MEDICATED_PAD | Freq: Once | CUTANEOUS | Status: DC

## 2024-01-16 MED ORDER — ACETAMINOPHEN 500 MG PO TABS
1000.0000 mg | ORAL_TABLET | ORAL | Status: AC
  Administered 2024-01-16: 1000 mg via ORAL
  Filled 2024-01-16: qty 2

## 2024-01-16 MED ORDER — OXYCODONE HCL 5 MG PO TABS: 5.0000 mg | ORAL_TABLET | Freq: Three times a day (TID) | ORAL | 0 refills | Status: AC | PRN

## 2024-01-16 MED ORDER — DEXMEDETOMIDINE HCL IN NACL 80 MCG/20ML IV SOLN
INTRAVENOUS | Status: DC | PRN
Start: 2024-01-16 — End: 2024-01-16
  Administered 2024-01-16 (×2): 8 ug via INTRAVENOUS

## 2024-01-16 MED ORDER — PHENYLEPHRINE 80 MCG/ML (10ML) SYRINGE FOR IV PUSH (FOR BLOOD PRESSURE SUPPORT)
PREFILLED_SYRINGE | INTRAVENOUS | Status: DC | PRN
  Administered 2024-01-16: 160 ug via INTRAVENOUS

## 2024-01-16 MED ORDER — MIDAZOLAM HCL 5 MG/5ML IJ SOLN
INTRAMUSCULAR | Status: DC | PRN
  Administered 2024-01-16 (×2): 2 mg via INTRAVENOUS

## 2024-01-16 MED ORDER — INSULIN ASPART 100 UNIT/ML IJ SOLN
0.0000 [IU] | INTRAMUSCULAR | Status: AC | PRN
  Administered 2024-01-16 (×2): 4 [IU] via SUBCUTANEOUS

## 2024-01-16 MED ORDER — INSULIN ASPART 100 UNIT/ML IJ SOLN
INTRAMUSCULAR | Status: DC
Start: 2024-01-16 — End: 2024-01-16
  Filled 2024-01-16: qty 1

## 2024-01-16 MED ORDER — CELECOXIB 200 MG PO CAPS
200.0000 mg | ORAL_CAPSULE | ORAL | Status: AC
  Administered 2024-01-16: 200 mg via ORAL
  Filled 2024-01-16: qty 1

## 2024-01-16 MED ORDER — ACETAMINOPHEN 325 MG PO TABS: 650.0000 mg | ORAL_TABLET | Freq: Four times a day (QID) | ORAL | 0 refills | Status: AC

## 2024-01-16 MED ORDER — PROPOFOL 10 MG/ML IV BOLUS
INTRAVENOUS | Status: DC | PRN
  Administered 2024-01-16: 100 ug/kg/min via INTRAVENOUS
  Administered 2024-01-16: 200 mg via INTRAVENOUS

## 2024-01-16 MED ORDER — ROCURONIUM BROMIDE 100 MG/10ML IV SOLN
INTRAVENOUS | Status: DC | PRN
  Administered 2024-01-16: 15 mg via INTRAVENOUS
  Administered 2024-01-16: 50 mg via INTRAVENOUS

## 2024-01-16 MED ORDER — FENTANYL CITRATE (PF) 100 MCG/2ML IJ SOLN
INTRAMUSCULAR | Status: AC
  Filled 2024-01-16: qty 2

## 2024-01-16 MED ORDER — ONDANSETRON HCL 4 MG/2ML IJ SOLN
INTRAMUSCULAR | Status: DC | PRN
  Administered 2024-01-16: 4 mg via INTRAVENOUS

## 2024-01-16 MED ORDER — OXYCODONE HCL 5 MG/5ML PO SOLN: 5.0000 mg | Freq: Once | ORAL | Status: DC | PRN

## 2024-01-16 MED ORDER — GABAPENTIN 300 MG PO CAPS
300.0000 mg | ORAL_CAPSULE | ORAL | Status: AC
  Administered 2024-01-16: 300 mg via ORAL
  Filled 2024-01-16: qty 1

## 2024-01-16 MED ORDER — 0.9 % SODIUM CHLORIDE (POUR BTL) OPTIME
TOPICAL | Status: DC | PRN
  Administered 2024-01-16: 1000 mL

## 2024-01-16 MED ORDER — BUPIVACAINE LIPOSOME 1.3 % IJ SUSP
INTRAMUSCULAR | Status: DC | PRN
  Administered 2024-01-16: 20 mL

## 2024-01-16 MED ORDER — ORAL CARE MOUTH RINSE: 15.0000 mL | Freq: Once | OROMUCOSAL | Status: AC

## 2024-01-16 MED ORDER — DEXAMETHASONE SODIUM PHOSPHATE 10 MG/ML IJ SOLN
INTRAMUSCULAR | Status: AC
  Filled 2024-01-16: qty 1

## 2024-01-16 MED ORDER — BUPIVACAINE LIPOSOME 1.3 % IJ SUSP
INTRAMUSCULAR | Status: AC
  Filled 2024-01-16: qty 20

## 2024-01-16 MED ORDER — ONDANSETRON HCL 4 MG/2ML IJ SOLN: 4.0000 mg | Freq: Four times a day (QID) | INTRAMUSCULAR | Status: DC | PRN

## 2024-01-16 MED ORDER — ONDANSETRON HCL 4 MG/2ML IJ SOLN
INTRAMUSCULAR | Status: AC
  Filled 2024-01-16: qty 2

## 2024-01-16 MED ORDER — IBUPROFEN 200 MG PO TABS: 600.0000 mg | ORAL_TABLET | Freq: Four times a day (QID) | ORAL | 0 refills | Status: AC

## 2024-01-16 MED ORDER — CEFAZOLIN SODIUM-DEXTROSE 2-4 GM/100ML-% IV SOLN
2.0000 g | INTRAVENOUS | Status: AC
  Administered 2024-01-16: 2 g via INTRAVENOUS
  Filled 2024-01-16: qty 100

## 2024-01-16 MED ORDER — BUPIVACAINE-EPINEPHRINE 0.25% -1:200000 IJ SOLN
INTRAMUSCULAR | Status: DC | PRN
  Administered 2024-01-16: 30 mL

## 2024-01-16 MED ORDER — LIDOCAINE HCL (CARDIAC) PF 100 MG/5ML IV SOSY
PREFILLED_SYRINGE | INTRAVENOUS | Status: DC | PRN
  Administered 2024-01-16: 60 mg via INTRAVENOUS

## 2024-01-16 MED ORDER — HYDROMORPHONE HCL 2 MG/ML IJ SOLN
INTRAMUSCULAR | Status: AC
  Filled 2024-01-16: qty 1

## 2024-01-16 MED ORDER — CHLORHEXIDINE GLUCONATE 0.12 % MT SOLN
15.0000 mL | Freq: Once | OROMUCOSAL | Status: AC
  Administered 2024-01-16: 15 mL via OROMUCOSAL

## 2024-01-16 MED ORDER — OXYCODONE HCL 5 MG PO TABS: 5.0000 mg | ORAL_TABLET | Freq: Once | ORAL | Status: DC | PRN

## 2024-01-16 MED ORDER — HYDROMORPHONE HCL 1 MG/ML IJ SOLN
INTRAMUSCULAR | Status: DC | PRN
  Administered 2024-01-16: .4 mg via INTRAVENOUS
  Administered 2024-01-16 (×2): .2 mg via INTRAVENOUS

## 2024-01-16 MED ORDER — FENTANYL CITRATE (PF) 100 MCG/2ML IJ SOLN
INTRAMUSCULAR | Status: DC | PRN
  Administered 2024-01-16: 100 ug via INTRAVENOUS

## 2024-01-16 MED ORDER — SUGAMMADEX SODIUM 200 MG/2ML IV SOLN
INTRAVENOUS | Status: DC | PRN
  Administered 2024-01-16: 250 mg via INTRAVENOUS

## 2024-01-16 SURGICAL SUPPLY — 45 items
APPLICATOR COTTON TIP 6 STRL (MISCELLANEOUS) IMPLANT
BAG COUNTER SPONGE SURGICOUNT (BAG) IMPLANT
BLADE SURG SZ11 CARB STEEL (BLADE) ×2 IMPLANT
CHLORAPREP W/TINT 26 (MISCELLANEOUS) ×2 IMPLANT
COVER MAYO STAND STRL (DRAPES) ×2 IMPLANT
COVER SURGICAL LIGHT HANDLE (MISCELLANEOUS) ×2 IMPLANT
COVER TIP SHEARS 8 DVNC (MISCELLANEOUS) ×2 IMPLANT
DERMABOND ADVANCED .7 DNX12 (GAUZE/BANDAGES/DRESSINGS) ×2 IMPLANT
DRAPE ARM DVNC X/XI (DISPOSABLE) ×6 IMPLANT
DRAPE COLUMN DVNC XI (DISPOSABLE) ×2 IMPLANT
DRIVER NDL MEGA SUTCUT DVNCXI (INSTRUMENTS) ×2 IMPLANT
DRIVER NDLE MEGA SUTCUT DVNCXI (INSTRUMENTS) ×1 IMPLANT
ELECT REM PT RETURN 15FT ADLT (MISCELLANEOUS) ×2 IMPLANT
FORCEPS BPLR 8 MD DVNC XI (FORCEP) ×2 IMPLANT
GAUZE 4X4 16PLY ~~LOC~~+RFID DBL (SPONGE) ×2 IMPLANT
GLOVE BIO SURGEON STRL SZ7 (GLOVE) ×4 IMPLANT
GOWN STRL REUS W/ TWL XL LVL3 (GOWN DISPOSABLE) ×4 IMPLANT
IRRIGATION SUCT STRKRFLW 2 WTP (MISCELLANEOUS) IMPLANT
KIT BASIN OR (CUSTOM PROCEDURE TRAY) ×2 IMPLANT
KIT TURNOVER KIT A (KITS) ×2 IMPLANT
MARKER SKIN DUAL TIP RULER LAB (MISCELLANEOUS) ×2 IMPLANT
MESH 3DMAX MID 5X7 LT XLRG (Mesh General) IMPLANT
NDL HYPO 22X1.5 SAFETY MO (MISCELLANEOUS) ×2 IMPLANT
NDL INSUFFLATION 14GA 120MM (NEEDLE) ×2 IMPLANT
NEEDLE HYPO 22X1.5 SAFETY MO (MISCELLANEOUS) ×1 IMPLANT
NEEDLE INSUFFLATION 14GA 120MM (NEEDLE) ×1 IMPLANT
OBTURATOR OPTICALSTD 8 DVNC (TROCAR) ×2 IMPLANT
PACK CARDIOVASCULAR III (CUSTOM PROCEDURE TRAY) ×2 IMPLANT
SCISSORS MNPLR CVD DVNC XI (INSTRUMENTS) ×2 IMPLANT
SEAL UNIV 5-12 XI (MISCELLANEOUS) ×6 IMPLANT
SOLUTION ANTFG W/FOAM PAD STRL (MISCELLANEOUS) ×2 IMPLANT
SOLUTION ELECTROSURG ANTI STCK (MISCELLANEOUS) ×2 IMPLANT
SPIKE FLUID TRANSFER (MISCELLANEOUS) ×2 IMPLANT
SUT MNCRL AB 4-0 PS2 18 (SUTURE) ×2 IMPLANT
SUT STRATA PDS 2-0 23 CT-1 (SUTURE) IMPLANT
SUT STRATAFIX SPIRAL PDS3-0 (SUTURE) ×2 IMPLANT
SUT VIC AB 2-0 SH 27X BRD (SUTURE) IMPLANT
SUT VIC AB 3-0 SH 27X BRD (SUTURE) ×2 IMPLANT
SYR 10ML LL (SYRINGE) ×2 IMPLANT
SYR 20ML LL LF (SYRINGE) ×2 IMPLANT
TAPE STRIPS DRAPE STRL (GAUZE/BANDAGES/DRESSINGS) ×2 IMPLANT
TOWEL GREEN STERILE FF (TOWEL DISPOSABLE) ×2 IMPLANT
TOWEL OR 17X26 10 PK STRL BLUE (TOWEL DISPOSABLE) ×2 IMPLANT
TROCAR Z-THREAD OPTICAL 5X100M (TROCAR) IMPLANT
TUBING INSUFFLATION 10FT LAP (TUBING) ×2 IMPLANT

## 2024-01-16 NOTE — Transfer of Care (Signed)
 Immediate Anesthesia Transfer of Care Note  Patient: Jack Hall.  Procedure(s) Performed: REPAIR, HERNIA, INGUINAL, ROBOT-ASSISTED, LAPAROSCOPIC, USING MESH (Left)  Patient Location: PACU  Anesthesia Type:General  Level of Consciousness: sedated  Airway & Oxygen Therapy: Patient Spontanous Breathing  Post-op Assessment: Report given to RN  Post vital signs: Reviewed and stable  Last Vitals:  Vitals Value Taken Time  BP 158/89 01/16/24 10:30  Temp 36.4 C 01/16/24 10:30  Pulse 85 01/16/24 10:36  Resp 5 01/16/24 10:36  SpO2 94 % 01/16/24 10:36  Vitals shown include unfiled device data.  Last Pain:  Vitals:   01/16/24 1030  TempSrc:   PainSc: Asleep         Complications: No notable events documented.

## 2024-01-16 NOTE — Op Note (Signed)
 Jack Hall. ([id number])  Operative Report   Date 01/16/2024  PREOPERATIVE DIAGNOSIS: Left Inguinal hernia  POSTOPERATIVE DIAGNOSIS: Same  PROCEDURE:  Robotic Left Inguinal Hernia Repair with Mesh (Bard 3D Midweight XL Left Polypropylene Mesh) Left-sided transversus abdominus plain block for post-operative pain   SURGEON: Cordella Idler, MD  ANESTHESIA: General Anesthesia    INTRAOPERATIVE FINDINGS: Large direct left-sided inguinal hernia  IMPLANTS: Implant Name Type Inv. Item Serial No. Manufacturer Lot No. LRB No. Used Action  MESH 3DMAX MID 5X7 LT MARSHA - [id number] Mesh General MESH 3DMAX MID 5X7 LT XLRG  [employer] BARD ACCESS [id number] Left 1 Implanted    ESTIMATED BLOOD LOSS: Minimal  COMPLICATIONS: None  SPECIMENS: None  OPERATIVE INDICATIONS: Pt is a 53 y.o. male who presents with a large, left inguinal hernia.  The patient desires definitive repair.  The procedure's risks, benefits, and alternatives were explained to the patient.  Risks, including the risks of bleeding, infection, need for mesh removal, and potential for hernia recurrence, were discussed.  The patient agreed to proceed and signed informed consent in front of a witness.   DESCRIPTION OF PROCEDURE: After preoperatively identifying the patient in holding, the patient was brought to the operating room and placed supine on the operating room table.  Both arms were tucked and padded to avoid potential nerve injury.  Sequential Compression Devices were placed bilaterally.  After induction of general anesthesia, the patient was given the appropriate perioperative antibiotics.  The abdomen was prepped and draped in a typical sterile fashion.  A JACHO approved time out, where the name of the patient, the operation, and the intended site were confirmed. The abdomen was accessed with a Veress needle via the standard drop technique in the LUQ at Palmer's point.  Insufflation was established.  An 8 mm optical  port was placed under direct visualization in the RLQ.  A camera was introduced into the abdomen, and a thorough inspection of the abdomen was performed to confirm there was no additional pathology.  Two additional 8 mm robotic ports were placed laterally under direct visualization, one superior to the umbilicus and another in the RLQ. The patient was then placed into 15-degree Trendelenburg position to facilitate examination of the bilateral inguinal spaces.  A thorough inspection of the abdomen was undertaken.  A large left direct inguinal hernia was identified and amenable to robotic repair.       The [employer] robot was brought into the field and docked.  An 8 mm 30-degree scope was placed in the mid-abdominal port.  The peritoneum was taken down 6 cm superior to the hernia from the anterior abdominal wall, and dissection was taken inferiorly towards the hernia.  Medial to the epigastric vessels, the parietal compartment was dissected and completed to visualize the rectus muscle.  This dissection was carried down to the symphysis pubis and obturator foramen.  The retropubic space was dissected to expose at least 2 cm contralateral to the midline.  Cooper's ligament was exposed and cleared at least 2 cm below the ligament to ensure adequate space for the inferior border of the mesh.  Hesselbach's triangle was cleared, assessing for a direct hernia. There was a large, direct inguinal hernia. The direct hernia was reduced, dissecting the contents away from the border of the transversalis fascia.  Any herniated femoral contents were reduced as well.  Lateral to the epigastric vessels, the dissection was carried out into the visceral compartment, continuing in the true preperitoneal plane.  The  dissection was continued until the cord structures were parietalized completely, allowing for continuous visualization of the reflected peritoneum with the line originating 2 cm below Cooper's medially and across the psoas  muscle in the lateral compartment.   Having achieved a complete dissection with a critical view of the entire myopectineal orifice, a piece of Bard 3D Midweight XL Left-sided Polypropylene Mesh was introduced into the field.  It was centered at the iliopubic tract, with the medial side crossing the midline and the inferior edge positioned 2 cm below Cooper's ligament.  With complete coverage of the myopectineal orifice, the inferior edge of the peritoneum was posterior and inferior to the mesh.  The lateral aspect of the mesh extended 3-5 cm beyond the lateral edge of the psoas.  Cephalad retraction of the peritoneal flap did not cause lifting of the inferior mesh edge or cord structures.  The mesh was fixated using an interrupted 3-0 Vicryl suture placed to the ipsilateral Coopers ligament.  The peritoneal flap was closed with a running 3-0 Stratafix barbed suture.  Additional holes in the peritoneum closed.  Hemostasis was assured in the entirety of the abdomen.  The two lateral ports were removed under direct visualization.  The abdomen was desufflated under direct visualization.  The port sites were closed, local anesthetic was infiltrated, and Dermabond was applied.  After confirming twice that the sponge, needle, and instrument counts were correct, the procedure was terminated, the patient was extubated and transferred to the recovery room in stable condition.     DISPOSITION: Stable to PACU.   Electronically Signed By: Cordella DELENA Idler 01/16/2024 10:06 AM

## 2024-01-16 NOTE — Anesthesia Procedure Notes (Signed)
 Procedure Name: Intubation Date/Time: 01/16/2024 8:43 AM  Performed by: Delores Duwaine SAUNDERS, CRNAPre-anesthesia Checklist: Patient identified, Emergency Drugs available, Suction available and Patient being monitored Patient Re-evaluated:Patient Re-evaluated prior to induction Oxygen Delivery Method: Circle System Utilized Preoxygenation: Pre-oxygenation with 100% oxygen Induction Type: IV induction Ventilation: Mask ventilation without difficulty and Oral airway inserted - appropriate to patient size Laryngoscope Size: Mac and 4 Grade View: Grade I Tube type: Oral Tube size: 7.5 mm Number of attempts: 1 Airway Equipment and Method: Stylet and Oral airway Placement Confirmation: ETT inserted through vocal cords under direct vision, positive ETCO2 and breath sounds checked- equal and bilateral Secured at: 23 cm Tube secured with: Tape Dental Injury: Teeth and Oropharynx as per pre-operative assessment

## 2024-01-16 NOTE — Discharge Instructions (Signed)

## 2024-01-16 NOTE — H&P (Signed)
 Jack Hall. 1971/02/19  [id number].    HPI:  53 y/o M w/ a large left-sided inguinal hernia who presents for elective repair. He reports that he is in his usual state of health and denies any recent changes in medication.   ROS: Review of Systems  Constitutional: Negative.   HENT: Negative.    Eyes: Negative.   Respiratory: Negative.    Cardiovascular: Negative.   Gastrointestinal: Negative.   Genitourinary: Negative.   Musculoskeletal: Negative.   Skin: Negative.   Neurological: Negative.   Endo/Heme/Allergies: Negative.   Psychiatric/Behavioral: Negative.      Family History  Problem Relation Age of Onset   Arthritis Mother    Diabetes Mother        grandmother   CAD Father    Sudden death Father        heart attack 34- constant stress, poor food choices, smoked heavier than patient   Colon polyps Sister        70 sessile serrated polyps.    Cancer Paternal Uncle        unknown type   Kidney cancer Maternal Grandfather    Prostate cancer Paternal Grandfather    Esophageal cancer Neg Hx    Rectal cancer Neg Hx    Stomach cancer Neg Hx     Past Medical History:  Diagnosis Date   Allergic rhinitis    otc flonase   Allergy    seasonal   Diabetes mellitus without complication (HCC)    Elevated coronary artery calcium  score 08/26/2021   Epididymal cyst 05/09/2017   Felt mass in scrotum- saw urgent care 1. No testicular mass or specific findings of torsion or orchitis. 2. Single bilateral epididymal cysts or spermatoceles, 1.2 cm on the right and 0.5 cm on the left. 3. Small bilateral hydroceles.   Family history of premature CAD June 01, 2015   Father died of heart attack at age 60. Saw cardiology 12/16/15 who did stress test and no ischemia noted   Genetic testing 08/01/2021   [facility] Multi-Cancer Panel was Negative. Report date is 07/28/2021.  The Multi-Cancer + RNA Panel offered by [facility] includes sequencing and/or deletion/duplication analysis of  the following 84 genes:  AIP*, ALK, APC*, ATM*, AXIN2*, BAP1*, BARD1*, BLM*, BMPR1A*, BRCA1*, BRCA2*, BRIP1*, CASR, CDC73*, CDH1*, CDK4, CDKN1B*, CDKN1C*, CDKN2A, CEBPA, CHEK2*, CTNNA1*, DICER1*, DIS3L2*, EGFR, EPCAM, FH*, F   GERD (gastroesophageal reflux disease)    prilosec- hiatal hernia   Hiatal hernia    History of colonic polyps 07/13/2021   Hyperlipidemia associated with type 2 diabetes mellitus (HCC) 01/06/2021   Hypertension    Left groin hernia 11/10/2015   May call for surgical referral in future-    Nonobstructive CAD 09/16/2021   -On 08/03/2021-Coronary calcium  score of 39.4. This was 96 percentile for age   Obesity    Poorly controlled type 2 diabetes mellitus (HCC) 07/07/2020   New diagnosis December 2021   Smoker 2015/06/01   1/2 PPD   Vitreomacular adhesion of both eyes 01/31/2022    Past Surgical History:  Procedure Laterality Date   COLONOSCOPY  09/01/2019   [facility]   WISDOM TOOTH EXTRACTION     general anesthesia    Social History:  reports that he has been smoking cigarettes. He has never used smokeless tobacco. He reports current alcohol use of about 5.0 - 8.0 standard drinks of alcohol per week. He reports current drug use. Drug: Marijuana.  Allergies: No Known Allergies  Medications Prior to Admission  Medication Sig Dispense Refill   Accu-Chek Softclix Lancets lancets Use to test blood sugars daily. Dx: E11.9 100 each 3   Ascorbic Acid (VITAMIN C) 1000 MG tablet Take 1,000 mg by mouth in the morning and at bedtime.     aspirin  EC 81 MG tablet Take 1 tablet (81 mg total) by mouth daily. Swallow whole. 90 tablet 3   Blood Glucose Monitoring Suppl (ACCU-CHEK AVIVA PLUS) w/Device KIT Use to test blood sugars daily.Dx: E11.9 1 kit 3   Cholecalciferol (VITAMIN D3 PO) Take 1,000 Units by mouth 3 (three) times a week. In the evening     Coenzyme Q10 (COQ10) 100 MG CAPS Take 100 mg by mouth in the morning.     Continuous Blood Gluc Receiver (FREESTYLE LIBRE 2  READER) DEVI 1 each by Does not apply route daily at 2 PM. Use to check blood sugars. Dx: E11.9 1 each 3   Cyanocobalamin (VITAMIN B-12 PO) Take 1,000 mcg by mouth in the morning.     glucose blood (ACCU-CHEK AVIVA PLUS) test strip Use to test blood sugars daily. Dx: E11.9 100 each 3   ibuprofen (ADVIL) 200 MG tablet Take 400 mg by mouth every 4 (four) hours as needed (pain.).     lisinopril  (ZESTRIL ) 10 MG tablet Take 1 tablet (10 mg total) by mouth daily. 90 tablet 3   MAGNESIUM GLYCINATE PO Take 400 mg by mouth every evening.     Menaquinone-7 (K2 PO) Take 1 tablet by mouth in the morning.     METAMUCIL FIBER PO Take 1 capsule by mouth in the morning and at bedtime.     metFORMIN  (GLUCOPHAGE ) 1000 MG tablet TAKE 1 TABLET (1,000 MG TOTAL) BY MOUTH TWICE A DAY WITH A MEAL 180 tablet 3   MILK THISTLE PO Take 1 capsule by mouth in the morning.     Omega-3 Fatty Acids (FISH OIL) 1000 MG CAPS Take 1,000 mg by mouth in the morning and at bedtime.     Omeprazole Magnesium (PRILOSEC OTC PO) Take 20 mg by mouth daily before breakfast.     OVER THE COUNTER MEDICATION Take 3 capsules by mouth in the morning. Lion's Mane Supplement     rosuvastatin  (CRESTOR ) 40 MG tablet TAKE 1 TABLET BY MOUTH EVERY DAY 90 tablet 3   vitamin A 3 MG (10000 UNITS) capsule Take 10,000 Units by mouth 3 (three) times a week.     fluticasone (FLONASE) 50 MCG/ACT nasal spray Place 1-2 sprays into both nostrils as needed for rhinitis or allergies.      Physical Exam: Blood pressure (!) 150/97, pulse 83, temperature 98 F (36.7 C), temperature source Oral, resp. rate 18, height 6' 1 (1.854 m), weight 111.1 kg, SpO2 96%. Gen: male, NAD Abd: soft, non-distended, left side marked  Results for orders placed or performed during the hospital encounter of 01/16/24 (from the past 48 hours)  Glucose, capillary     Status: Abnormal   Collection Time: 01/16/24  6:55 AM  Result Value Ref Range   Glucose-Capillary 207 (H) 70 - 99 mg/dL     Comment: Glucose reference range applies only to samples taken after fasting for at least 8 hours.   Comment 1 Notify RN    No results found.  Assessment/Plan 53 y/o M w/ a left inguinal hernia  - Will proceed to the OR. - Will proceed to the OR. We discussed the alternatives and potential risks of surgery, including but not limited to: bleeding, infection, damage  to bowel or surrounding structures, damage to the vas/cord, mesh complications, chronic pain, recurrent hernia, and need for additional procedures. All questions were addressed and consent was obtained.    Cordella DELENA Polly Marlis Cheron Surgery 01/16/2024, 8:03 AM Please see Amion for pager number during day hours 7:00am-4:30pm or 7:00am -11:30am on weekends

## 2024-01-17 ENCOUNTER — Encounter (HOSPITAL_COMMUNITY): Payer: Self-pay | Admitting: General Surgery

## 2024-01-17 NOTE — Anesthesia Postprocedure Evaluation (Signed)
 Anesthesia Post Note  Patient: Jack Hall.  Procedure(s) Performed: REPAIR, HERNIA, INGUINAL, ROBOT-ASSISTED, LAPAROSCOPIC, USING MESH (Left)     Patient location during evaluation: PACU Anesthesia Type: General Level of consciousness: awake and alert Pain management: pain level controlled Vital Signs Assessment: post-procedure vital signs reviewed and stable Respiratory status: spontaneous breathing, nonlabored ventilation, respiratory function stable and patient connected to nasal cannula oxygen Cardiovascular status: blood pressure returned to baseline and stable Postop Assessment: no apparent nausea or vomiting Anesthetic complications: no   No notable events documented.  Last Vitals:  Vitals:   01/16/24 1130 01/16/24 1140  BP: (!) 145/93 (!) 145/95  Pulse: 85 84  Resp: 13   Temp:  36.7 C  SpO2: 93% 94%    Last Pain:  Vitals:   01/16/24 1140  TempSrc: (P) Oral  PainSc: 0-No pain                 Mazi Brailsford S

## 2024-03-14 ENCOUNTER — Encounter: Payer: Self-pay | Admitting: Family Medicine

## 2024-03-14 ENCOUNTER — Ambulatory Visit: Payer: BC Managed Care – PPO | Admitting: Family Medicine

## 2024-03-14 VITALS — BP 150/74 | HR 96 | Temp 98.2°F | Ht 73.0 in | Wt 243.6 lb

## 2024-03-14 DIAGNOSIS — E1165 Type 2 diabetes mellitus with hyperglycemia: Secondary | ICD-10-CM

## 2024-03-14 DIAGNOSIS — Z122 Encounter for screening for malignant neoplasm of respiratory organs: Secondary | ICD-10-CM

## 2024-03-14 DIAGNOSIS — R809 Proteinuria, unspecified: Secondary | ICD-10-CM

## 2024-03-14 DIAGNOSIS — F17200 Nicotine dependence, unspecified, uncomplicated: Secondary | ICD-10-CM

## 2024-03-14 DIAGNOSIS — Z860100 Personal history of colon polyps, unspecified: Secondary | ICD-10-CM

## 2024-03-14 DIAGNOSIS — Z0000 Encounter for general adult medical examination without abnormal findings: Secondary | ICD-10-CM | POA: Diagnosis not present

## 2024-03-14 DIAGNOSIS — E1169 Type 2 diabetes mellitus with other specified complication: Secondary | ICD-10-CM | POA: Diagnosis not present

## 2024-03-14 DIAGNOSIS — E1129 Type 2 diabetes mellitus with other diabetic kidney complication: Secondary | ICD-10-CM | POA: Diagnosis not present

## 2024-03-14 DIAGNOSIS — Z7984 Long term (current) use of oral hypoglycemic drugs: Secondary | ICD-10-CM

## 2024-03-14 DIAGNOSIS — Z125 Encounter for screening for malignant neoplasm of prostate: Secondary | ICD-10-CM

## 2024-03-14 DIAGNOSIS — E785 Hyperlipidemia, unspecified: Secondary | ICD-10-CM

## 2024-03-14 MED ORDER — LISINOPRIL 20 MG PO TABS: 20.0000 mg | ORAL_TABLET | Freq: Every day | ORAL | 3 refills | Status: DC

## 2024-03-14 NOTE — Progress Notes (Signed)
 Phone: [phone]   Subjective:  Patient presents today for their annual physical. Chief complaint-noted.   See problem oriented charting- ROS- full  review of systems was completed and negative  except for topics noted under acute/chronic concerns  The following were reviewed and entered/updated in epic: Past Medical History:  Diagnosis Date   Allergic rhinitis    otc flonase   Allergy    seasonal   Diabetes mellitus without complication (HCC)    Elevated coronary artery calcium  score 08/26/2021   Epididymal cyst 05/09/2017   Felt mass in scrotum- saw urgent care 1. No testicular mass or specific findings of torsion or orchitis. 2. Single bilateral epididymal cysts or spermatoceles, 1.2 cm on the right and 0.5 cm on the left. 3. Small bilateral hydroceles.   Family history of premature CAD 2015-06-07   Father died of heart attack at age 17. Saw cardiology 12/16/15 who did stress test and no ischemia noted   Genetic testing 08/01/2021   [facility] Multi-Cancer Panel was Negative. Report date is 07/28/2021.  The Multi-Cancer + RNA Panel offered by [facility] includes sequencing and/or deletion/duplication analysis of the following 84 genes:  AIP*, ALK, APC*, ATM*, AXIN2*, BAP1*, BARD1*, BLM*, BMPR1A*, BRCA1*, BRCA2*, BRIP1*, CASR, CDC73*, CDH1*, CDK4, CDKN1B*, CDKN1C*, CDKN2A, CEBPA, CHEK2*, CTNNA1*, DICER1*, DIS3L2*, EGFR, EPCAM, FH*, F   GERD (gastroesophageal reflux disease)    prilosec- hiatal hernia   Hiatal hernia    History of colonic polyps 07/13/2021   Hyperlipidemia associated with type 2 diabetes mellitus (HCC) 01/06/2021   Hypertension    Left groin hernia 11/10/2015   May call for surgical referral in future-    Nonobstructive CAD 09/16/2021   -On 08/03/2021-Coronary calcium  score of 39.4. This was 96 percentile for age   Obesity    Poorly controlled type 2 diabetes mellitus (HCC) 07/07/2020   New diagnosis December 2021   Smoker 06/07/2015   1/2 PPD   Vitreomacular  adhesion of both eyes 01/31/2022   Patient Active Problem List   Diagnosis Date Noted   Nonobstructive CAD 09/16/2021    Priority: High   Elevated coronary artery calcium  score 08/26/2021    Priority: High   Poorly controlled type 2 diabetes mellitus (HCC) 07/07/2020    Priority: High   Smoker 06/07/2015    Priority: High   Family history of premature CAD 06/07/15    Priority: High   Genetic testing 08/01/2021    Priority: Medium    Hyperlipidemia associated with type 2 diabetes mellitus (HCC) 01/06/2021    Priority: Medium    Left groin hernia 11/10/2015    Priority: Medium    Obesity     Priority: Medium    Allergy 08/22/2021    Priority: Low   Hiatal hernia 08/22/2021    Priority: Low   History of colonic polyps 07/13/2021    Priority: Low   Epididymal cyst 05/09/2017    Priority: Low   Allergic rhinitis     Priority: Low   GERD (gastroesophageal reflux disease)     Priority: Low   Vitreomacular adhesion of both eyes 01/31/2022   Diabetes mellitus without complication (HCC) 08/22/2021   Past Surgical History:  Procedure Laterality Date   COLONOSCOPY  09/01/2019   [facility]   WISDOM TOOTH EXTRACTION     general anesthesia   XI ROBOTIC ASSISTED INGUINAL HERNIA REPAIR WITH MESH Left 01/16/2024   Procedure: REPAIR, HERNIA, INGUINAL, ROBOT-ASSISTED, LAPAROSCOPIC, USING MESH;  Surgeon: Polly Cordella LABOR, MD;  Location: [facility];  Service: General;  Laterality: Left;  ROBOTIC POSSIBLE OPEN LEFT INGUINAL HERNIA REPAIR WITH MESH    Family History  Problem Relation Age of Onset   Arthritis Mother    Diabetes Mother        grandmother   CAD Father    Sudden death Father        heart attack 50- constant stress, poor food choices, smoked heavier than patient   Colon polyps Sister        33 sessile serrated polyps.    Cancer Paternal Uncle        unknown type   Kidney cancer Maternal Grandfather    Prostate cancer Paternal Grandfather    Esophageal cancer Neg Hx     Rectal cancer Neg Hx    Stomach cancer Neg Hx     Medications- reviewed and updated Current Outpatient Medications  Medication Sig Dispense Refill   Accu-Chek Softclix Lancets lancets Use to test blood sugars daily. Dx: E11.9 100 each 3   Ascorbic Acid (VITAMIN C) 1000 MG tablet Take 1,000 mg by mouth in the morning and at bedtime.     aspirin  EC 81 MG tablet Take 1 tablet (81 mg total) by mouth daily. Swallow whole. 90 tablet 3   Blood Glucose Monitoring Suppl (ACCU-CHEK AVIVA PLUS) w/Device KIT Use to test blood sugars daily.Dx: E11.9 1 kit 3   Cholecalciferol (VITAMIN D3 PO) Take 1,000 Units by mouth 3 (three) times a week. In the evening     Coenzyme Q10 (COQ10) 100 MG CAPS Take 100 mg by mouth in the morning.     Continuous Blood Gluc Receiver (FREESTYLE LIBRE 2 READER) DEVI 1 each by Does not apply route daily at 2 PM. Use to check blood sugars. Dx: E11.9 1 each 3   Cyanocobalamin (VITAMIN B-12 PO) Take 1,000 mcg by mouth in the morning.     fluticasone (FLONASE) 50 MCG/ACT nasal spray Place 1-2 sprays into both nostrils as needed for rhinitis or allergies.     glucose blood (ACCU-CHEK AVIVA PLUS) test strip Use to test blood sugars daily. Dx: E11.9 100 each 3   ibuprofen  (ADVIL ) 200 MG tablet Take 400 mg by mouth every 4 (four) hours as needed (pain.).     MAGNESIUM GLYCINATE PO Take 400 mg by mouth every evening.     Menaquinone-7 (K2 PO) Take 1 tablet by mouth in the morning.     METAMUCIL FIBER PO Take 1 capsule by mouth in the morning and at bedtime.     metFORMIN  (GLUCOPHAGE ) 1000 MG tablet TAKE 1 TABLET (1,000 MG TOTAL) BY MOUTH TWICE A DAY WITH A MEAL 180 tablet 3   MILK THISTLE PO Take 1 capsule by mouth in the morning.     Omega-3 Fatty Acids (FISH OIL) 1000 MG CAPS Take 1,000 mg by mouth in the morning and at bedtime.     Omeprazole Magnesium (PRILOSEC OTC PO) Take 20 mg by mouth daily before breakfast.     OVER THE COUNTER MEDICATION Take 3 capsules by mouth in the morning.  Lion's Mane Supplement     rosuvastatin  (CRESTOR ) 40 MG tablet TAKE 1 TABLET BY MOUTH EVERY DAY 90 tablet 3   vitamin A 3 MG (10000 UNITS) capsule Take 10,000 Units by mouth 3 (three) times a week.     lisinopril  (ZESTRIL ) 20 MG tablet Take 1 tablet (20 mg total) by mouth daily. 90 tablet 3   No current facility-administered medications for this visit.    Allergies-reviewed and updated  No Known Allergies  Social History   Social History Narrative   Family: Single- not dating lately. Living alone now. 2020- got new poodle just before Covid 19.       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 No college      Hobbies: time at gym usually 4-5 days a week, friends and family, vacation      Right Handed    Lives in a one story home    Objective  Objective:  BP (!) 150/74   Pulse 96   Temp 98.2 F (36.8 C) (Temporal)   Ht 6' 1 (1.854 m)   Wt 243 lb 9.6 oz (110.5 kg)   SpO2 98%   BMI 32.14 kg/m  Gen: NAD, resting comfortably HEENT: Mucous membranes are moist. Oropharynx normal Neck: no thyromegaly CV: RRR no murmurs rubs or gallops Lungs: CTAB no crackles, wheeze, rhonchi Abdomen: soft/nontender/nondistended/normal bowel sounds. No rebound or guarding.  Ext: no edema Skin: warm, dry Neuro: grossly normal, moves all extremities, PERRLA Slight bulge in left groin- improved from prior- possible lingering inflammation from surgery   Assessment and Plan  53 y.o. male presenting for annual physical.  Health Maintenance counseling: 1. Anticipatory guidance: Patient counseled regarding regular dental exams -q6 months, eye exams - needs to schedule yearly- refer today,  avoiding smoking and second hand smoke- see below , limiting alcohol to 2 beverages per day - 2-3 a week, no illicit drugs - prior marijuana.   2. Risk factor reduction:  Advised patient of need for regular exercise and diet rich and  fruits and vegetables to reduce risk of heart attack and stroke.  Exercise- getting more mobile after surgery but was a set back- has gotten up to 6 miles- had been daily before.  Diet/weight management-down 2 lbs from surgery - trying to eat healthier and had made good progress.  Wt Readings from Last 3 Encounters:  03/14/24 243 lb 9.6 oz (110.5 kg)  01/16/24 245 lb (111.1 kg)  12/26/23 240 lb (108.9 kg)   3. Immunizations/screenings/ancillary studies-recommended fall flu shot, recommended yearly diabetic eye exam.   Immunization History  Administered Date(s) Administered   Influenza Whole 05/03/2019   Influenza,inj,Quad PF,6+ Mos 05/05/2021, 05/12/2022   Influenza-Unspecified 04/30/2017, 04/22/2018, 05/05/2020, 05/01/2023   PFIZER(Purple Top)SARS-COV-2 Vaccination 10/02/2019, 10/23/2019, 07/02/2020   PNEUMOCOCCAL CONJUGATE-20 08/18/2022   Pfizer Covid-19 Vaccine Bivalent Booster 88yrs & up 05/06/2021   Pfizer(Comirnaty)Fall Seasonal Vaccine 12 years and older 05/01/2023   Tdap 11/10/2015   Zoster Recombinant(Shingrix ) 05/22/2023, 09/04/2023  4. Prostate cancer screening- low risk prior trend- update PSA with labs Lab Results  Component Value Date   PSA 0.25 05/17/2023   PSA 0.22 06/24/2021   PSA 0.29 07/06/2020   5. Colon cancer screening - colonoscopy almost due.  Offered referral- opts in- last in 2022 but 3 year repeat with 8 polyps  history 6. Skin cancer screening- dermatology yearly still. advised regular sunscreen use. Denies worrisome, changing, or new skin lesions.  7. Smoking associated screening (lung cancer screening, AAA screen 65-75, UA)- current smoker- check UA. Lung cancer screening refer again 8. STD screening - declines as not currently active and no recent unprotected sex  Status of chronic or acute concerns    #Hernia repair July 2- healing well -does have some possible lingering inflammation- feel slight bulge in area but was similar at surgery visit and  they gave  good report- I would recheck next visit as long as stable or improving or have him return to surgeon if worsesns. He is not really having pain thankfully  # Diabetes-peak A1c 10.7 in June 2022 S: Medication:Metformin  1000 mg twice daily- no side effects -Lisinopril  started with microalbumin to creatinine ratio up to 387 from 160s (lab error at original check we believed it to only be 16 initially) - tried jardiance  but felt dizzy and nauseous CBGs-Freestyle libre ordered- has but never started yet Exercise and diet- improving as gets further out from surgery Lab Results  Component Value Date   HGBA1C 7.8 (H) 12/12/2023   HGBA1C 9.5 (A) 09/04/2023   HGBA1C 9.4 (H) 05/17/2023   A/P: Significant improvement from last visit.  Since he just had surgery recently he would like to give this another 3 months and then recheck all labs-this was ordered and he will come by a few days before his next visit.  He wants to continue current medications metformin  only for now but we have previously discussed othertreatment options  - Farxiga  costly - Strongly prefers no insulin  - Very concerned about side effects with Ozempic or Mounjaro -Actos not ideal with smoking history -discussed glipizide or glimipiride but hypoglycemia and weight gain concerns -Discussed Januvia but declines  #hypertension S: medication: Lisinopril  10 mg A/P: blood pressure slightly up today but had it down last visit- we are going to increase lisinopril  to 20 mg for blood pressure control but also to help the microalbumin in urine issue   #hyperlipidemia- premature CAD history and father #Nonobstructive CAD-follows with Dr. Revankar S: Medication:Rosuvastatin  40 mg daily ( use to be Atorvastatin  40 mg daily), aspirin  81 mg -On 08/03/2021-Coronary calcium  score of 39.4. This was 96 percentile for age -Low-grade stress test on 09/14/2021 -no chest pain or shortness of breath evne with walking Lab Results  Component Value  Date   CHOL 101 12/12/2023   HDL 38.40 (L) 12/12/2023   LDLCALC 47 12/12/2023   LDLDIRECT 47.0 09/16/2021   TRIG 77.0 12/12/2023   CHOLHDL 3 12/12/2023   A/P: update lipids when comes back for labs. Coronary artery disease asymptomatic continue to monitor     # smoking- 1/2 PPD or less- encouraged cessation particularly now with nonobstructive CAD/elevated coronary artery calcium  scoring- not ready to quit- encouraged cessation    Recommended follow up: Return in about 3 months (around 06/14/2024).  Lab/Order associations:no labs but  fasting   ICD-10-CM   1. Preventative health care  Z00.00     2. Controlled type 2 diabetes mellitus with microalbuminuria, without long-term current use of insulin  (HCC)  E11.29 Comprehensive metabolic panel with GFR   R80.9 CBC with Differential/Platelet    Lipid panel    Hemoglobin A1c    Microalbumin / creatinine urine ratio    Ambulatory referral to Ophthalmology    3. Smoker  F17.200 Urinalysis, Routine w reflex microscopic    Ambulatory Referral for Lung Cancer Scre    4. Hyperlipidemia associated with type 2 diabetes mellitus (HCC)  E11.69 Urinalysis, Routine w reflex microscopic   E78.5     5. Screening for prostate cancer  Z12.5 PSA    6. History of colon polyps  Z86.0100 Ambulatory referral to Gastroenterology    7. Screening for lung cancer  Z12.2 Ambulatory Referral for Lung Cancer Scre    8. Poorly controlled type 2 diabetes mellitus (HCC)  E11.65 lisinopril  (ZESTRIL ) 20 MG tablet      Meds ordered this encounter  Medications   lisinopril  (ZESTRIL ) 20 MG tablet    Sig: Take 1 tablet (20 mg total) by mouth daily.    Dispense:  90 tablet    Refill:  3    Return precautions advised.  Garnette Lukes, MD

## 2024-03-14 NOTE — Patient Instructions (Addendum)
 Health Maintenance Due  Topic Date Due   Colonoscopy  03/23/2024   GI contact Please call to schedule visit and/or procedure IF you do not hear within a week Address: [address] [place], [place], [place] [address] Phone: [phone]   We have placed a referral for you today to lung cancer screening and to optho(eye doctor)- please call their # if you do not hear within a week (may be listed below or you may see mychart message within a few days with #).   we are going to increase lisinopril  to 20 mg for blood pressure control but also to help the microalbumin in urine issue  Your blood pressure is higher than I would like in office. I would like for you to buy/use a home cuff to check at least 4x a week. Your goal is <135/85.  Update me in 2-3 weeks by mychart.  - at next visit if you would liek for us  to compare cuffs can bring cuff with you   Recommended follow up: Return in about 3 months (around 06/14/2024) for followup or sooner if needed.Schedule b4 you leave. -schedule labs before next visit- I ordered these today. Fasting that day

## 2024-04-09 ENCOUNTER — Telehealth: Payer: Self-pay

## 2024-04-09 DIAGNOSIS — F17210 Nicotine dependence, cigarettes, uncomplicated: Secondary | ICD-10-CM

## 2024-04-09 DIAGNOSIS — Z122 Encounter for screening for malignant neoplasm of respiratory organs: Secondary | ICD-10-CM

## 2024-04-09 DIAGNOSIS — Z87891 Personal history of nicotine dependence: Secondary | ICD-10-CM

## 2024-04-09 NOTE — Telephone Encounter (Signed)
 Lung Cancer Screening Narrative/Criteria Questionnaire (Cigarette Smokers Only- No Cigars/Pipes/vapes)   Jack Hall Mannie Mickey.   SDMV:04/14/2024 at 10:45 with [place]         April 25, 1971                           LDCT: 04/25/2024 at 10:30 at [facility]    53 y.o.   Phone: [phone]  Lung Screening Narrative (confirm age 64-77 yrs Medicare / 50-80 yrs Private pay insurance)   Insurance information: BCBS   Referring Provider: Katrinka, MD   This screening involves an initial phone call with a team member from our program. It is called a shared decision making visit. The initial meeting is required by  insurance and Medicare to make sure you understand the program. This appointment takes about 15-20 minutes to complete. You will complete the screening scan at your scheduled date/time.  This scan takes about 5-10 minutes to complete. You can eat and drink normally before and after the scan.  Criteria questions for Lung Cancer Screening:   Are you a current or former smoker? Current Age began smoking: 21   If you are a former smoker, what year did you quit smoking?Quit one year and started back (within 15 yrs)   To calculate your smoking history, I need an accurate estimate of how many packs of cigarettes you smoked per day and for how many years. (Not just the number of PPD you are now smoking)   Years smoking 31 x Packs per day 1 = Pack years 31   (at least 20 pack yrs)   (Make sure they understand that we need to know how much they have smoked in the past, not just the number of PPD they are smoking now)  Do you have a personal history of cancer?  No    Do you have a family history of cancer? Yes  (cancer type and and relative) Paternal Grandfather with colon cancer. Maternal grandfather had kidney and liver cancer.   Are you coughing up blood?  No  Have you had unexplained weight loss of 15 lbs or more in the last 6 months? No  It looks like you meet all criteria.  When would be a good  time for us  to schedule you for this screening?   Additional information: N/A

## 2024-04-11 ENCOUNTER — Encounter: Payer: Self-pay | Admitting: Family Medicine

## 2024-04-14 ENCOUNTER — Encounter: Payer: Self-pay | Admitting: Adult Health

## 2024-04-14 ENCOUNTER — Ambulatory Visit (INDEPENDENT_AMBULATORY_CARE_PROVIDER_SITE_OTHER): Admitting: Adult Health

## 2024-04-14 DIAGNOSIS — F17210 Nicotine dependence, cigarettes, uncomplicated: Secondary | ICD-10-CM | POA: Diagnosis not present

## 2024-04-14 NOTE — Patient Instructions (Signed)

## 2024-04-14 NOTE — Progress Notes (Signed)
  Virtual Visit via Telephone Note  I connected with Jack Hall. , 04/14/24 10:45 AM by a telemedicine application and verified that I am speaking with the correct person using two identifiers.  Location: Patient: home Provider: home   I discussed the limitations of evaluation and management by telemedicine and the availability of in person appointments. The patient expressed understanding and agreed to proceed.   Shared Decision Making Visit Lung Cancer Screening Program [phone])   Eligibility: 53 y.o. Pack Years Smoking History Calculation = 31 pack years  (# packs/per year x # years smoked) Recent History of coughing up blood  no Unexplained weight loss? no ( >Than 15 pounds within the last 6 months ) Prior History Lung / other cancer no (Diagnosis within the last 5 years already requiring surveillance chest CT Scans). Smoking Status Current Smoker  Visit Components: Discussion included one or more decision making aids. YES Discussion included risk/benefits of screening. YES Discussion included potential follow up diagnostic testing for abnormal scans. YES Discussion included meaning and risk of over diagnosis. YES Discussion included meaning and risk of False Positives. YES Discussion included meaning of total radiation exposure. YES  Counseling Included: Importance of adherence to annual lung cancer LDCT screening. YES Impact of comorbidities on ability to participate in the program. YES Ability and willingness to under diagnostic treatment. YES  Smoking Cessation Counseling: Current Smokers:  Discussed importance of smoking cessation. yes Information about tobacco cessation classes and interventions provided to patient. yes Patient provided with ticket for LDCT Scan. yes Symptomatic Patient. NO Diagnosis Code: Tobacco Use Z72.0 Asymptomatic Patient yes  Counseling - 4 minutes of smoking cessation counseling (CT Chest Lung Cancer Screening Low Dose  W/O CM) [id number]  Z12.2-Screening of respiratory organs Z87.891-Personal history of nicotine dependence   Jack Hall 04/14/24

## 2024-04-25 ENCOUNTER — Ambulatory Visit (HOSPITAL_BASED_OUTPATIENT_CLINIC_OR_DEPARTMENT_OTHER)
Admission: RE | Admit: 2024-04-25 | Discharge: 2024-04-25 | Disposition: A | Discharge status: Home / Self Care | Source: Ambulatory Visit | Attending: Acute Care | Admitting: Acute Care

## 2024-04-25 DIAGNOSIS — Z87891 Personal history of nicotine dependence: Secondary | ICD-10-CM | POA: Diagnosis not present

## 2024-04-25 DIAGNOSIS — F17210 Nicotine dependence, cigarettes, uncomplicated: Secondary | ICD-10-CM | POA: Diagnosis not present

## 2024-04-25 DIAGNOSIS — Z122 Encounter for screening for malignant neoplasm of respiratory organs: Secondary | ICD-10-CM | POA: Insufficient documentation

## 2024-04-29 ENCOUNTER — Other Ambulatory Visit: Payer: Self-pay

## 2024-04-29 DIAGNOSIS — Z122 Encounter for screening for malignant neoplasm of respiratory organs: Secondary | ICD-10-CM

## 2024-04-29 DIAGNOSIS — Z87891 Personal history of nicotine dependence: Secondary | ICD-10-CM

## 2024-04-29 DIAGNOSIS — F17210 Nicotine dependence, cigarettes, uncomplicated: Secondary | ICD-10-CM

## 2024-05-01 DIAGNOSIS — H5203 Hypermetropia, bilateral: Secondary | ICD-10-CM | POA: Diagnosis not present

## 2024-05-01 DIAGNOSIS — E119 Type 2 diabetes mellitus without complications: Secondary | ICD-10-CM | POA: Diagnosis not present

## 2024-05-01 DIAGNOSIS — H52203 Unspecified astigmatism, bilateral: Secondary | ICD-10-CM | POA: Diagnosis not present

## 2024-05-01 LAB — OPHTHALMOLOGY REPORT-SCANNED

## 2024-05-02 ENCOUNTER — Encounter: Payer: Self-pay | Admitting: Family Medicine

## 2024-06-06 ENCOUNTER — Ambulatory Visit: Payer: Self-pay | Admitting: Family Medicine

## 2024-06-06 ENCOUNTER — Other Ambulatory Visit

## 2024-06-06 DIAGNOSIS — E1129 Type 2 diabetes mellitus with other diabetic kidney complication: Secondary | ICD-10-CM | POA: Diagnosis not present

## 2024-06-06 DIAGNOSIS — F17200 Nicotine dependence, unspecified, uncomplicated: Secondary | ICD-10-CM

## 2024-06-06 DIAGNOSIS — E785 Hyperlipidemia, unspecified: Secondary | ICD-10-CM

## 2024-06-06 DIAGNOSIS — R809 Proteinuria, unspecified: Secondary | ICD-10-CM

## 2024-06-06 DIAGNOSIS — E1169 Type 2 diabetes mellitus with other specified complication: Secondary | ICD-10-CM | POA: Diagnosis not present

## 2024-06-06 DIAGNOSIS — Z125 Encounter for screening for malignant neoplasm of prostate: Secondary | ICD-10-CM

## 2024-06-06 LAB — URINALYSIS, ROUTINE W REFLEX MICROSCOPIC
Bilirubin Urine: NEGATIVE
Hgb urine dipstick: NEGATIVE
Leukocytes,Ua: NEGATIVE
Nitrite: NEGATIVE
Specific Gravity, Urine: 1.025 (ref 1.000–1.030)
Total Protein, Urine: 100 — AB
Urine Glucose: 500 — AB
Urobilinogen, UA: 1 (ref 0.0–1.0)
pH: 6 (ref 5.0–8.0)

## 2024-06-06 LAB — CBC WITH DIFFERENTIAL/PLATELET
Basophils Absolute: 0 K/uL (ref 0.0–0.1)
Basophils Relative: 0.3 % (ref 0.0–3.0)
Eosinophils Absolute: 0.1 K/uL (ref 0.0–0.7)
Eosinophils Relative: 1.4 % (ref 0.0–5.0)
HCT: 46 % (ref 39.0–52.0)
Hemoglobin: 16.1 g/dL (ref 13.0–17.0)
Lymphocytes Relative: 34.8 % (ref 12.0–46.0)
Lymphs Abs: 2.1 K/uL (ref 0.7–4.0)
MCHC: 35 g/dL (ref 30.0–36.0)
MCV: 91 fl (ref 78.0–100.0)
Monocytes Absolute: 0.5 K/uL (ref 0.1–1.0)
Monocytes Relative: 7.7 % (ref 3.0–12.0)
Neutro Abs: 3.3 K/uL (ref 1.4–7.7)
Neutrophils Relative %: 55.8 % (ref 43.0–77.0)
Platelets: 203 K/uL (ref 150.0–400.0)
RBC: 5.06 Mil/uL (ref 4.22–5.81)
RDW: 13.1 % (ref 11.5–15.5)
WBC: 6 K/uL (ref 4.0–10.5)

## 2024-06-06 LAB — LIPID PANEL
Cholesterol: 126 mg/dL (ref 0–200)
HDL: 31.2 mg/dL — ABNORMAL LOW (ref >39.00)
LDL Cholesterol: 73 mg/dL (ref 0–99)
NonHDL: 95.28
Total CHOL/HDL Ratio: 4
Triglycerides: 112 mg/dL (ref 0.0–149.0)
VLDL: 22.4 mg/dL (ref 0.0–40.0)

## 2024-06-06 LAB — COMPREHENSIVE METABOLIC PANEL WITH GFR
ALT: 19 U/L (ref 0–53)
AST: 17 U/L (ref 0–37)
Albumin: 4.6 g/dL (ref 3.5–5.2)
Alkaline Phosphatase: 62 U/L (ref 39–117)
BUN: 16 mg/dL (ref 6–23)
CO2: 29 meq/L (ref 19–32)
Calcium: 9.3 mg/dL (ref 8.4–10.5)
Chloride: 99 meq/L (ref 96–112)
Creatinine, Ser: 0.74 mg/dL (ref 0.40–1.50)
GFR: 104.26 mL/min (ref >60.00)
Glucose, Bld: 214 mg/dL — ABNORMAL HIGH (ref 70–99)
Potassium: 4.3 meq/L (ref 3.5–5.1)
Sodium: 137 meq/L (ref 135–145)
Total Bilirubin: 0.8 mg/dL (ref 0.2–1.2)
Total Protein: 6.8 g/dL (ref 6.0–8.3)

## 2024-06-06 LAB — MICROALBUMIN / CREATININE URINE RATIO
Creatinine,U: 232.6 mg/dL
Microalb Creat Ratio: 309 mg/g — ABNORMAL HIGH (ref 0.0–30.0)
Microalb, Ur: 71.9 mg/dL — ABNORMAL HIGH (ref 0.0–1.9)

## 2024-06-06 LAB — HEMOGLOBIN A1C: Hgb A1c MFr Bld: 9.3 % — ABNORMAL HIGH (ref 4.6–6.5)

## 2024-06-06 LAB — PSA: PSA: 0.2 ng/mL (ref 0.10–4.00)

## 2024-06-20 ENCOUNTER — Ambulatory Visit: Admitting: Family Medicine

## 2024-06-20 ENCOUNTER — Encounter: Payer: Self-pay | Admitting: Family Medicine

## 2024-06-20 VITALS — BP 138/86 | HR 86 | Temp 97.7°F | Ht 73.0 in | Wt 240.8 lb

## 2024-06-20 DIAGNOSIS — R809 Proteinuria, unspecified: Secondary | ICD-10-CM | POA: Insufficient documentation

## 2024-06-20 DIAGNOSIS — E1165 Type 2 diabetes mellitus with hyperglycemia: Secondary | ICD-10-CM

## 2024-06-20 DIAGNOSIS — E1169 Type 2 diabetes mellitus with other specified complication: Secondary | ICD-10-CM

## 2024-06-20 DIAGNOSIS — J439 Emphysema, unspecified: Secondary | ICD-10-CM | POA: Insufficient documentation

## 2024-06-20 DIAGNOSIS — F17200 Nicotine dependence, unspecified, uncomplicated: Secondary | ICD-10-CM

## 2024-06-20 MED ORDER — LISINOPRIL 40 MG PO TABS: 40.0000 mg | ORAL_TABLET | Freq: Every day | ORAL | 3 refills | Status: AC

## 2024-06-20 NOTE — Patient Instructions (Addendum)
 [facility] contact Please call to schedule visit and/or procedure IF you do not hear within a week Address: [address] [place], [place], [place] [address] Phone: [phone]   Blood pressure reasonable today-thankful for improvement but discussed going up to 40 mg dose to further help with microalbuminuria and lower blood pressure further   Your plan was to aggressively work on lifestyle changes since you've had success before with this  Recommended follow up: Return in about 6 months (around 12/19/2024) for followup or sooner if needed.Schedule b4 you leave. Schedule labs at least a few days before- I ordered those today for you

## 2024-06-20 NOTE — Progress Notes (Signed)
 Phone [phone] In person visit   Subjective:   Jack Hall. is a 53 y.o. year old very pleasant male patient who presents for/with See problem oriented charting Chief Complaint  Patient presents with   Medical Management of Chronic Issues    Here for a 3 month follow-up to touch base about labs, eye test and lung scan.    Past Medical History-  Patient Active Problem List   Diagnosis Date Noted   Emphysema lung (HCC) 06/20/2024    Priority: High   Nonobstructive CAD 09/16/2021    Priority: High   Elevated coronary artery calcium  score 08/26/2021    Priority: High   Poorly controlled type 2 diabetes mellitus (HCC) 07/07/2020    Priority: High   Smoker 05/12/2015    Priority: High   Family history of premature CAD 05/12/2015    Priority: High   Genetic testing 08/01/2021    Priority: Medium    Hyperlipidemia associated with type 2 diabetes mellitus (HCC) 01/06/2021    Priority: Medium    Left groin hernia 11/10/2015    Priority: Medium    Obesity     Priority: Medium    Allergy 08/22/2021    Priority: Low   Hiatal hernia 08/22/2021    Priority: Low   History of colonic polyps 07/13/2021    Priority: Low   Epididymal cyst 05/09/2017    Priority: Low   Allergic rhinitis     Priority: Low   GERD (gastroesophageal reflux disease)     Priority: Low   Microalbuminuria 06/20/2024   Vitreomacular adhesion of both eyes 01/31/2022    Medications- reviewed and updated Current Outpatient Medications  Medication Sig Dispense Refill   Accu-Chek Softclix Lancets lancets Use to test blood sugars daily. Dx: E11.9 100 each 3   Ascorbic Acid (VITAMIN C) 1000 MG tablet Take 1,000 mg by mouth in the morning and at bedtime.     aspirin  EC 81 MG tablet Take 1 tablet (81 mg total) by mouth daily. Swallow whole. 90 tablet 3   Blood Glucose Monitoring Suppl (ACCU-CHEK AVIVA PLUS) w/Device KIT Use to test blood sugars daily.Dx: E11.9 1 kit 3   Cholecalciferol (VITAMIN D3  PO) Take 1,000 Units by mouth 3 (three) times a week. In the evening     Coenzyme Q10 (COQ10) 100 MG CAPS Take 100 mg by mouth in the morning.     Continuous Blood Gluc Receiver (FREESTYLE LIBRE 2 READER) DEVI 1 each by Does not apply route daily at 2 PM. Use to check blood sugars. Dx: E11.9 1 each 3   Cyanocobalamin (VITAMIN B-12 PO) Take 1,000 mcg by mouth in the morning.     fluticasone (FLONASE) 50 MCG/ACT nasal spray Place 1-2 sprays into both nostrils as needed for rhinitis or allergies.     glucose blood (ACCU-CHEK AVIVA PLUS) test strip Use to test blood sugars daily. Dx: E11.9 100 each 3   ibuprofen  (ADVIL ) 200 MG tablet Take 400 mg by mouth every 4 (four) hours as needed (pain.).     MAGNESIUM GLYCINATE PO Take 400 mg by mouth every evening.     Menaquinone-7 (K2 PO) Take 1 tablet by mouth in the morning.     METAMUCIL FIBER PO Take 1 capsule by mouth in the morning and at bedtime.     metFORMIN  (GLUCOPHAGE ) 1000 MG tablet TAKE 1 TABLET (1,000 MG TOTAL) BY MOUTH TWICE A DAY WITH A MEAL 180 tablet 3   MILK THISTLE PO Take 1  capsule by mouth in the morning.     Omega-3 Fatty Acids (FISH OIL) 1000 MG CAPS Take 1,000 mg by mouth in the morning and at bedtime.     Omeprazole Magnesium (PRILOSEC OTC PO) Take 20 mg by mouth daily before breakfast.     OVER THE COUNTER MEDICATION Take 3 capsules by mouth in the morning. [employer] Mane Supplement     rosuvastatin  (CRESTOR ) 40 MG tablet TAKE 1 TABLET BY MOUTH EVERY DAY 90 tablet 3   tretinoin (RETIN-A) 0.025 % cream SMARTSIG:sparingly Topical Every Night PRN     vitamin A 3 MG (10000 UNITS) capsule Take 10,000 Units by mouth 3 (three) times a week.     lisinopril  (ZESTRIL ) 40 MG tablet Take 1 tablet (40 mg total) by mouth daily. 90 tablet 3   No current facility-administered medications for this visit.     Objective:  BP 138/86 (BP Location: Left Arm, Patient Position: Sitting, Cuff Size: Normal)   Pulse 86   Temp 97.7 F (36.5 C) (Temporal)    Ht 6' 1 (1.854 m)   Wt 240 lb 12.8 oz (109.2 kg)   SpO2 97%   BMI 31.77 kg/m  Gen: NAD, resting comfortably CV: RRR no murmurs rubs or gallops Lungs: CTAB no crackles, wheeze, rhonchi Ext: no edema Skin: warm, dry Neuro: grossly normal, moves all extremities    Assessment and Plan     #health maintenance- plans to call for colonoscopy  # Diabetes-peak A1c 10.7 in June 2022 with microalbuminuria S: Medication:Metformin  1000 mg twice daily- no side effects - tried jardiance  but felt dizzy and nauseous -Farxiga  too costly CBGs-Freestyle libre ordered- has but never started. He is trying of the get back on his diet- has odone a good job on getting exercise up. States was not surprised by the a1c.  Lab Results  Component Value Date   HGBA1C 9.3 (H) 06/06/2024   HGBA1C 7.8 (H) 12/12/2023   HGBA1C 9.5 (A) 09/04/2023  A/P: Diabetes poorly controlled.  Unfortunately has worsened.  We discussed options again (he only wants to work on lifestyle right now though)  - Farxiga  costly - Strongly prefers no insulin  - Very concerned about side effects with Ozempic or Mounjaro- wants to hold off  -Actos not ideal with smoking history -discussed glipizide or glimipiride but hypoglycemia and weight gain concerns -Discussed Januvia but declines  #hypertension S: medication: Lisinopril  10 mg--> 20mg  BP Readings from Last 3 Encounters:  06/20/24 138/86  03/14/24 (!) 150/74  01/16/24 (!) 145/95  A/P: Blood pressure reasonable today-thankful for improvement but discussed going up to 40 mg dose to further help with microalbuminuria and lower blood pressure further   #hyperlipidemia- premature CAD history and father #Nonobstructive CAD-follows with Dr. Edwyna S: Medication:Rosuvastatin  40 mg daily ( use to be Atorvastatin  40 mg daily)- pretty consistent, aspirin  81 mg -On 08/03/2021-Coronary calcium  score of 39.4. This was 96 percentile for age -Low-grade stress test on 09/14/2021 Lab Results   Component Value Date   CHOL 126 06/06/2024   HDL 31.20 (L) 06/06/2024   LDLCALC 73 06/06/2024   LDLDIRECT 47.0 09/16/2021   TRIG 112.0 06/06/2024   CHOLHDL 4 06/06/2024   A/P: lipids hair high but working on lifestyle change to reverse this and will continue current medications    # smoking- 1/2 PPD or less- encouraged cessation particularly now with nonobstructive CAD/elevated coronary artery calcium  scoring- not ready to quit   - emphysema noted- asymptomatic- enocuraged cessation- continue to monitor   #Fatty  liver on Ct lung cancer screening.  Thankfully LFTs normal on labs Lab Results  Component Value Date   ALT 19 06/06/2024   AST 17 06/06/2024   ALKPHOS 62 06/06/2024   BILITOT 0.8 06/06/2024   Recommended follow up: Return in about 6 months (around 12/19/2024) for followup or sooner if needed.Schedule b4 you leave. Initial recommendation was 14 weeks but he wants more time  Lab/Order associations:   ICD-10-CM   1. Microalbuminuria  R80.9     2. Smoker  F17.200     3. Poorly controlled type 2 diabetes mellitus (HCC)  E11.65 lisinopril  (ZESTRIL ) 40 MG tablet    Comprehensive metabolic panel with GFR    Hemoglobin A1c    Microalbumin / creatinine urine ratio    LDL cholesterol, direct    4. Hyperlipidemia associated with type 2 diabetes mellitus (HCC)  E11.69 Comprehensive metabolic panel with GFR   E78.5 Hemoglobin A1c    Microalbumin / creatinine urine ratio    LDL cholesterol, direct    5. Emphysema lung (HCC)  J43.9       Meds ordered this encounter  Medications   lisinopril  (ZESTRIL ) 40 MG tablet    Sig: Take 1 tablet (40 mg total) by mouth daily.    Dispense:  90 tablet    Refill:  3    Return precautions advised.  Garnette Lukes, MD

## 2024-07-01 ENCOUNTER — Other Ambulatory Visit: Payer: Self-pay | Admitting: Family Medicine

## 2024-08-12 ENCOUNTER — Encounter: Payer: Self-pay | Admitting: Gastroenterology

## 2024-08-26 ENCOUNTER — Encounter

## 2024-09-10 ENCOUNTER — Encounter: Admitting: Gastroenterology

## 2024-12-12 ENCOUNTER — Other Ambulatory Visit

## 2024-12-19 ENCOUNTER — Ambulatory Visit: Admitting: Family Medicine
